# Patient Record
Sex: Female | Born: 1995 | Race: Black or African American | Hispanic: No | Marital: Single | Smoking: Former smoker
Health system: Southern US, Community
[De-identification: ages and names within clinical notes are randomized; demographics above are authoritative.]

## PROBLEM LIST (undated history)

## (undated) ENCOUNTER — Inpatient Hospital Stay (HOSPITAL_COMMUNITY): Payer: Self-pay

## (undated) DIAGNOSIS — J40 Bronchitis, not specified as acute or chronic: Secondary | ICD-10-CM

## (undated) DIAGNOSIS — L309 Dermatitis, unspecified: Secondary | ICD-10-CM

## (undated) DIAGNOSIS — R51 Headache: Secondary | ICD-10-CM

## (undated) DIAGNOSIS — J45909 Unspecified asthma, uncomplicated: Secondary | ICD-10-CM

## (undated) DIAGNOSIS — R519 Headache, unspecified: Secondary | ICD-10-CM

## (undated) HISTORY — PX: APPENDECTOMY: SHX54

## (undated) HISTORY — PX: HERNIA REPAIR: SHX51

---
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
## 2014-01-04 ENCOUNTER — Inpatient Hospital Stay (HOSPITAL_COMMUNITY)
Admission: AD | Admit: 2014-01-04 | Discharge: 2014-01-04 | Disposition: A | Discharge status: Home / Self Care | Payer: Medicaid Other | Source: Ambulatory Visit | Attending: Obstetrics & Gynecology | Admitting: Obstetrics & Gynecology

## 2014-01-04 ENCOUNTER — Encounter (HOSPITAL_COMMUNITY): Payer: Self-pay | Admitting: *Deleted

## 2014-01-04 DIAGNOSIS — F17210 Nicotine dependence, cigarettes, uncomplicated: Secondary | ICD-10-CM | POA: Insufficient documentation

## 2014-01-04 DIAGNOSIS — N946 Dysmenorrhea, unspecified: Secondary | ICD-10-CM | POA: Diagnosis not present

## 2014-01-04 DIAGNOSIS — N939 Abnormal uterine and vaginal bleeding, unspecified: Secondary | ICD-10-CM | POA: Diagnosis present

## 2014-01-04 LAB — URINALYSIS, ROUTINE W REFLEX MICROSCOPIC
Bilirubin Urine: NEGATIVE
Glucose, UA: NEGATIVE mg/dL
Ketones, ur: NEGATIVE mg/dL
LEUKOCYTES UA: NEGATIVE
NITRITE: NEGATIVE
PH: 6 (ref 5.0–8.0)
Protein, ur: NEGATIVE mg/dL
SPECIFIC GRAVITY, URINE: 1.02 (ref 1.005–1.030)
Urobilinogen, UA: 0.2 mg/dL (ref 0.0–1.0)

## 2014-01-04 LAB — WET PREP, GENITAL
Clue Cells Wet Prep HPF POC: NONE SEEN
Trich, Wet Prep: NONE SEEN
YEAST WET PREP: NONE SEEN

## 2014-01-04 LAB — POCT PREGNANCY, URINE: Preg Test, Ur: NEGATIVE

## 2014-01-04 LAB — URINE MICROSCOPIC-ADD ON

## 2014-01-04 LAB — HCG, QUANTITATIVE, PREGNANCY: hCG, Beta Chain, Quant, S: 2 m[IU]/mL (ref <5)

## 2014-01-04 NOTE — Discharge Instructions (Signed)
Your pregnancy test here today is negative and your blood work shows you are not pregnant. Take ibuprofen as needed for your cramps. Follow up with your GYN or return here as needed.   Dysmenorrhea Menstrual cramps (dysmenorrhea) are caused by the muscles of the uterus tightening (contracting) during a menstrual period. For some women, this discomfort is merely bothersome. For others, dysmenorrhea can be severe enough to interfere with everyday activities for a few days each month. Primary dysmenorrhea is menstrual cramps that last a couple of days when you start having menstrual periods or soon after. This often begins after a teenager starts having her period. As a woman gets older or has a baby, the cramps will usually lessen or disappear. Secondary dysmenorrhea begins later in life, lasts longer, and the pain may be stronger than primary dysmenorrhea. The pain may start before the period and last a few days after the period.  CAUSES  Dysmenorrhea is usually caused by an underlying problem, such as:  The tissue lining the uterus grows outside of the uterus in other areas of the body (endometriosis).  The endometrial tissue, which normally lines the uterus, is found in or grows into the muscular walls of the uterus (adenomyosis).  The pelvic blood vessels are engorged with blood just before the menstrual period (pelvic congestive syndrome).  Overgrowth of cells (polyps) in the lining of the uterus or cervix.  Falling down of the uterus (prolapse) because of loose or stretched ligaments.  Depression.  Bladder problems, infection, or inflammation.  Problems with the intestine, a tumor, or irritable bowel syndrome.  Cancer of the female organs or bladder.  A severely tipped uterus.  A very tight opening or closed cervix.  Noncancerous tumors of the uterus (fibroids).  Pelvic inflammatory disease (PID).  Pelvic scarring (adhesions) from a previous surgery.  Ovarian cyst.  An  intrauterine device (IUD) used for birth control. RISK FACTORS You may be at greater risk of dysmenorrhea if:  You are younger than age 19.  You started puberty early.  You have irregular or heavy bleeding.  You have never given birth.  You have a family history of this problem.  You are a smoker. SIGNS AND SYMPTOMS   Cramping or throbbing pain in your lower abdomen.  Headaches.  Lower back pain.  Nausea or vomiting.  Diarrhea.  Sweating or dizziness.  Loose stools. DIAGNOSIS  A diagnosis is based on your history, symptoms, physical exam, diagnostic tests, or procedures. Diagnostic tests or procedures may include:  Blood tests.  Ultrasonography.  An examination of the lining of the uterus (dilation and curettage, D&C).  An examination inside your abdomen or pelvis with a scope (laparoscopy).  X-rays.  CT scan.  MRI.  An examination inside the bladder with a scope (cystoscopy).  An examination inside the intestine or stomach with a scope (colonoscopy, gastroscopy). TREATMENT  Treatment depends on the cause of the dysmenorrhea. Treatment may include:  Pain medicine prescribed by your health care provider.  Birth control pills or an IUD with progesterone hormone in it.  Hormone replacement therapy.  Nonsteroidal anti-inflammatory drugs (NSAIDs). These may help stop the production of prostaglandins.  Surgery to remove adhesions, endometriosis, ovarian cyst, or fibroids.  Removal of the uterus (hysterectomy).  Progesterone shots to stop the menstrual period.  Cutting the nerves on the sacrum that go to the female organs (presacral neurectomy).  Electric current to the sacral nerves (sacral nerve stimulation).  Antidepressant medicine.  Psychiatric therapy, counseling, or group therapy.  Exercise and physical therapy.  Meditation and yoga therapy.  Acupuncture. HOME CARE INSTRUCTIONS   Only take over-the-counter or prescription medicines as  directed by your health care provider.  Place a heating pad or hot water bottle on your lower back or abdomen. Do not sleep with the heating pad.  Use aerobic exercises, walking, swimming, biking, and other exercises to help lessen the cramping.  Massage to the lower back or abdomen may help.  Stop smoking.  Avoid alcohol and caffeine. SEEK MEDICAL CARE IF:   Your pain does not get better with medicine.  You have pain with sexual intercourse.  Your pain increases and is not controlled with medicines.  You have abnormal vaginal bleeding with your period.  You develop nausea or vomiting with your period that is not controlled with medicine. SEEK IMMEDIATE MEDICAL CARE IF:  You pass out.  Document Released: 01/04/2005 Document Revised: 09/06/2012 Document Reviewed: 06/22/2012 [facility] Patient Information 2015 [place], [place]. This information is not intended to replace advice given to you by your health care provider. Make sure you discuss any questions you have with your health care provider.

## 2014-01-04 NOTE — MAU Provider Note (Signed)
History     CSN: [id number]  Arrival date and time: 01/04/14 1408   None     Chief Complaint  Patient presents with  . Abdominal Pain  . Vaginal Bleeding   Patient is a 18 y.o. female presenting with abdominal pain and vaginal bleeding. The history is provided by the patient.  Abdominal Pain The primary symptoms of the illness include abdominal pain and vaginal bleeding. The current episode started 6 to 12 hours ago. The onset of the illness was sudden.  The patient has not had a change in bowel habit.  Vaginal Bleeding Associated symptoms include abdominal pain.   Cassidy Meyer is a 18 y.o. G3P0020 who presents to the MAU with bleeding that started early this morning. She reports having had a positive HPT about 3 days ago. She reports abdominal cramping in addition to the bleeding. She denies any other problems today.   OB History    Gravida Para Term Preterm AB TAB SAB Ectopic Multiple Living   3 0   2           Past Medical History  Diagnosis Date  . Asthma   . Bronchitis     Past Surgical History  Procedure Laterality Date  . Appendectomy    . Hernia repair      No family history on file.  History  Substance Use Topics  . Smoking status: Current Every Day Smoker    Types: Cigarettes  . Smokeless tobacco: Never Used  . Alcohol Use: No    Allergies: No Known Allergies  Prescriptions prior to admission  Medication Sig Dispense Refill Last Dose  . cephALEXin (KEFLEX) 500 MG capsule Take 1 capsule (500 mg total) by mouth 4 (four) times daily. 40 capsule 0   . metroNIDAZOLE (FLAGYL) 500 MG tablet Take 1 tablet (500 mg total) by mouth 2 (two) times daily. 14 tablet 0     Review of Systems  Gastrointestinal: Positive for abdominal pain.  Genitourinary: Positive for vaginal bleeding.  all other systems negative  Blood pressure 125/66, pulse 74, temperature 99.4 F (37.4 C), temperature source Oral, resp. rate 16, height 5' 3.5" (1.613 m), weight 137 lb  3.2 oz (62.234 kg), last menstrual period 11/05/2013.  Physical Exam  Constitutional: She is oriented to person, place, and time. She appears well-developed and well-nourished. No distress.  HENT:  Head: Normocephalic and atraumatic.  Eyes: Conjunctivae and EOM are normal.  Neck: Neck supple.  Cardiovascular: Normal rate.   Respiratory: Effort normal.  GI: Soft. There is tenderness.  Mildly tender with deep palpation lower abdomen.   Genitourinary:  External genital without lesions. Small blood vaginal vault. Mild CMT, no adnexal tenderness, uterus without palpable enlargement.   Musculoskeletal: Normal range of motion.  Neurological: She is alert and oriented to person, place, and time.  Skin: Skin is warm and dry.  Psychiatric: She has a normal mood and affect. Her behavior is normal. Judgment and thought content normal.    MAU Course  Procedures Results for orders placed or performed during the hospital encounter of 01/04/14 (from the past 24 hour(s))  Urinalysis, Routine w reflex microscopic     Status: Abnormal   Collection Time: 01/04/14  2:50 PM  Result Value Ref Range   Color, Urine YELLOW YELLOW   APPearance CLOUDY (A) CLEAR   Specific Gravity, Urine 1.020 1.005 - 1.030   pH 6.0 5.0 - 8.0   Glucose, UA NEGATIVE NEGATIVE mg/dL   Hgb urine dipstick LARGE (A)  NEGATIVE   Bilirubin Urine NEGATIVE NEGATIVE   Ketones, ur NEGATIVE NEGATIVE mg/dL   Protein, ur NEGATIVE NEGATIVE mg/dL   Urobilinogen, UA 0.2 0.0 - 1.0 mg/dL   Nitrite NEGATIVE NEGATIVE   Leukocytes, UA NEGATIVE NEGATIVE  Urine microscopic-add on     Status: Abnormal   Collection Time: 01/04/14  2:50 PM  Result Value Ref Range   Squamous Epithelial / LPF FEW (A) RARE   RBC / HPF TOO NUMEROUS TO COUNT <3 RBC/hpf   Bacteria, UA FEW (A) RARE  Pregnancy, urine POC     Status: None   Collection Time: 01/04/14  2:57 PM  Result Value Ref Range   Preg Test, Ur NEGATIVE NEGATIVE  hCG, quantitative, pregnancy      Status: None   Collection Time: 01/04/14  4:03 PM  Result Value Ref Range   hCG, Beta Chain, Quant, S 2 <5 mIU/mL    MDM 18 y.o. female with vaginal bleeding and lower abdominal cramping that started today. Patient was concerned because she had a positive HPT 3 days ago. Pregnancy test here today negative and Bhcg<5. Will treat for dysmenorrhea.   Assessment and Plan  Dysmenorrhea Ibuprofen as needed for pain. Follow up with GYN, return here as needed.   NEESE,HOPE 01/04/2014, 4:40 PM

## 2014-01-04 NOTE — MAU Note (Signed)
Pos HPT 2 days ago - stated pt was 1-2 weeks, started cramping & bleeding last night.

## 2014-01-05 LAB — GC/CHLAMYDIA PROBE AMP
CT PROBE, AMP APTIMA: NEGATIVE
GC Probe RNA: NEGATIVE

## 2014-01-24 ENCOUNTER — Other Ambulatory Visit (HOSPITAL_COMMUNITY)
Admission: RE | Admit: 2014-01-24 | Discharge: 2014-01-24 | Disposition: A | Discharge status: Home / Self Care | Payer: Medicaid Other | Source: Ambulatory Visit | Attending: Family Medicine | Admitting: Family Medicine

## 2014-01-24 ENCOUNTER — Encounter: Payer: Self-pay | Admitting: Family Medicine

## 2014-01-24 ENCOUNTER — Ambulatory Visit (INDEPENDENT_AMBULATORY_CARE_PROVIDER_SITE_OTHER): Payer: Medicaid Other | Admitting: Family Medicine

## 2014-01-24 VITALS — BP 112/57 | HR 73 | Temp 98.3°F | Resp 18 | Wt 130.0 lb

## 2014-01-24 DIAGNOSIS — Z7251 High risk heterosexual behavior: Secondary | ICD-10-CM

## 2014-01-24 DIAGNOSIS — N760 Acute vaginitis: Secondary | ICD-10-CM

## 2014-01-24 DIAGNOSIS — Z113 Encounter for screening for infections with a predominantly sexual mode of transmission: Secondary | ICD-10-CM | POA: Diagnosis not present

## 2014-01-24 DIAGNOSIS — N898 Other specified noninflammatory disorders of vagina: Secondary | ICD-10-CM

## 2014-01-24 DIAGNOSIS — Z3009 Encounter for other general counseling and advice on contraception: Secondary | ICD-10-CM

## 2014-01-24 DIAGNOSIS — A499 Bacterial infection, unspecified: Secondary | ICD-10-CM

## 2014-01-24 DIAGNOSIS — B9689 Other specified bacterial agents as the cause of diseases classified elsewhere: Secondary | ICD-10-CM

## 2014-01-24 LAB — POCT URINALYSIS DIPSTICK
BILIRUBIN UA: NEGATIVE
Blood, UA: NEGATIVE
GLUCOSE UA: NEGATIVE
Ketones, UA: NEGATIVE
Leukocytes, UA: NEGATIVE
NITRITE UA: NEGATIVE
PH UA: 6
Protein, UA: NEGATIVE
Spec Grav, UA: >=1.03
UROBILINOGEN UA: 0.2

## 2014-01-24 LAB — POCT WET PREP (WET MOUNT): Clue Cells Wet Prep Whiff POC: POSITIVE

## 2014-01-24 LAB — POCT URINE PREGNANCY: PREG TEST UR: NEGATIVE

## 2014-01-24 MED ORDER — CLINDAMYCIN HCL 300 MG PO CAPS: 300.0000 mg | ORAL_CAPSULE | Freq: Two times a day (BID) | ORAL | Status: DC

## 2014-01-24 NOTE — Progress Notes (Signed)
Subjective: Stacy GardnerJessica Crochet is a 19 y.o. female presenting for recurrent vaginal discharge.  Having vaginal discharge for 12 months. White, constant, with fishy odor. Has tried metrogel, po flagyl and keflex without improvement in symptoms. No vaginal irritation/burning. No dyspareunia. Has sexual intercourse with single female using condoms inconsistently (depends only on availability). [facility]   - Menstrual periods are irregular lasting 3-7 days every 28-35 days.  LMP: 01/11/2014 - Review of Systems: Denies fever, VB, breast swelling/tenderness, abdominal pain, genital ulcers or missed menstrual period. - Nonsmoker, denies drinking EtOH and other drugs Objective: BP 112/57 mmHg  Pulse 73  Temp(Src) 98.3 F (36.8 C) (Oral)  Resp 18  Wt 130 lb (58.968 kg)  SpO2 100%  LMP 01/11/2014  Gen:  19 y.o. female in no distress Pelvic: External genitalia within normal limits.  Vaginal mucosa pink, moist, normal rugae.  Nonfriable cervix without lesions, moderate white malodorous discharge noted on speculum exam. No cervical motion tenderness.   Jimmy FootmanSara Evans, CMA present throughout duration of exam.  Assessment/Plan: Stacy GardnerJessica Flatt is a 19 y.o. female here for recurrent vaginal discharge.  1. 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 General counseling and advice on female contraception - F/u with PCP; favoring nexplanon  3. Unprotected sexual intercourse - Extensive counseling during today's OV.  - Check STD's and UPreg

## 2014-01-24 NOTE — Assessment & Plan Note (Signed)
Discussed risks and consequences of unprotected intercourse at length. Only moderate insight into this. Uses condoms with single female partner irregularly.  - Has reservations about infection risks with IUD and nuva ring (though the insignificance of this risk in proportion to pregnancy risk was reinforced) - Has not tried depo and is apprehensive due to hair loss and weight gain in a friend receiving this - Has demonstrated inability to reliably take daily OCPs - From this discussion I believe nexplanon may be an ideal option for her. All questions answered and she will make a follow up appointment with her PCP in the next few weeks to discuss this further and hopefully decide on reliable contraceptive option.

## 2014-01-24 NOTE — Assessment & Plan Note (Addendum)
Chronic problem beginning about 12 months ago. Chronic and refractory vs. recurrent. Denies ANY improvement in symptoms with ALL treatments so doubt this is simply recurrence. Though, still did recommend that her partner be evaluated as soon as possible.  Has received oral flagyl, keflex and metrogel without improvement.  - Given exam today we will try tinidazole or clindamycin po (pending negative UPT) which will require prior authorization for medicaid. Will avoid clindamycin ovules as these may diminish effectiveness of condoms (her only, albeit irregular, form of contraception)

## 2014-01-24 NOTE — Patient Instructions (Signed)
Please schedule an appointment to discuss birth control options with Dr. Lum BabeEniola, your primary doctor, before leaving today.

## 2014-01-25 ENCOUNTER — Encounter: Payer: Self-pay | Admitting: *Deleted

## 2014-01-25 ENCOUNTER — Telehealth: Payer: Self-pay | Admitting: *Deleted

## 2014-01-25 NOTE — Telephone Encounter (Signed)
-----   Message from Tyrone Nineyan B Grunz, MD sent at 01/24/2014  5:47 PM EST ----- Please call to let her know labs showed BV and a prescription for clindamycin x7 days. this may require prior authorization which I will be happy to complete.

## 2014-01-25 NOTE — Progress Notes (Unsigned)
Pt walked into clinic this AM requesting results from her wet prep.  Precept with Dr. Randolm IdolFletke; reviewed office notes and informed that the provider has to review results and will give her a call due to her previous failure of other medications for bacterial vaginosis.  Clovis PuMartin, Yamilee Harmes L, RN

## 2014-01-25 NOTE — Telephone Encounter (Signed)
Called pt and informed her of the below information and instructed her if she had any problems to give us a call. Lamonte SakaiZimmerman Rumple, Yoshino Broccoli D

## 2014-01-28 LAB — CERVICOVAGINAL ANCILLARY ONLY
CHLAMYDIA, DNA PROBE: NEGATIVE
Neisseria Gonorrhea: NEGATIVE

## 2014-01-30 NOTE — Progress Notes (Signed)
Can you ask her if she got the prescription for clindamycin? This is what was prescribed for the recurrent BV on wet prep.

## 2014-04-19 ENCOUNTER — Ambulatory Visit (INDEPENDENT_AMBULATORY_CARE_PROVIDER_SITE_OTHER): Payer: Medicaid Other | Admitting: Family Medicine

## 2014-04-19 VITALS — BP 107/80 | HR 76 | Temp 98.3°F | Ht 63.0 in | Wt 124.3 lb

## 2014-04-19 DIAGNOSIS — R51 Headache: Secondary | ICD-10-CM

## 2014-04-19 DIAGNOSIS — F32A Depression, unspecified: Secondary | ICD-10-CM

## 2014-04-19 DIAGNOSIS — F329 Major depressive disorder, single episode, unspecified: Secondary | ICD-10-CM

## 2014-04-19 DIAGNOSIS — M26609 Unspecified temporomandibular joint disorder, unspecified side: Secondary | ICD-10-CM | POA: Insufficient documentation

## 2014-04-19 DIAGNOSIS — R519 Headache, unspecified: Secondary | ICD-10-CM

## 2014-04-19 DIAGNOSIS — M2660 Temporomandibular joint disorder, unspecified: Secondary | ICD-10-CM

## 2014-04-19 MED ORDER — CITALOPRAM HYDROBROMIDE 20 MG PO TABS: 20.0000 mg | ORAL_TABLET | Freq: Every day | ORAL | Status: DC

## 2014-04-19 NOTE — Assessment & Plan Note (Addendum)
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 No signs of LAD in area the patient is concerned about. No signs of swelling or infection. 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 Discussed icing the area and continuing tylenol for discomfort. If this does not improve or she develops an additional lump, she is to f/u with her PCP.

## 2014-04-19 NOTE — Patient Instructions (Signed)
Nice to meet you. 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 You can take tylenol and ice the area for this pain.  Please let us know if this continues so we can reevaluate. Please continue to take the tylenol for your headaches. We will start you on celexa for the depression. Please contact [facility] for your depression to schedule an appointment.  Please seek medical attention if you develop numbness, weakness, change in vision, facial droop, or change in gait.  Seek medical attention if you develop thoughts of hurting your self or anyone else.
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
## 2014-04-21 ENCOUNTER — Encounter: Payer: Self-pay | Admitting: Family Medicine

## 2014-04-21 DIAGNOSIS — R519 Headache, unspecified: Secondary | ICD-10-CM | POA: Insufficient documentation

## 2014-04-21 DIAGNOSIS — F329 Major depressive disorder, single episode, unspecified: Secondary | ICD-10-CM | POA: Insufficient documentation

## 2014-04-21 DIAGNOSIS — F32A Depression, unspecified: Secondary | ICD-10-CM | POA: Insufficient documentation

## 2014-04-21 DIAGNOSIS — R51 Headache: Secondary | ICD-10-CM

## 2014-04-21 NOTE — Assessment & Plan Note (Signed)
Throughout the whole encounter the patient and her friends felt everything we discussed was funny. I discussed with them the severity of this issue in that if she were to have thoughts of hurting herself or others or ever developed a plan to do these things we would have to send her to the ED for evaluation and if this happened while she was at home she would need to call 911 for immediate assistance. She voiced understanding of this. No SI, plan to hurt herself, or intent to hurt her self. She does note some thoughts of hurting others, though no plan to do this or intent to do this. Will start the patient on celexa to treat this depression. I recommended that she call [facility] for therapy and gave her the handout with information on [facility]. Strongly urged her to set up an appointment with [facility]. Given return precautions.   Precepted with Dr Lum BabeEniola.

## 2014-04-21 NOTE — Assessment & Plan Note (Signed)
New onset headaches. Is neurologically intact. Sounds like could be migranous in nature, though unilateral and lasting for a defined period of time brings cluster headaches into question. Could also be tension type headaches given they coincide with onset of right TMJ. Since tylenol relieves discomfort urged to continue tylenol as needed. Will continue to monitor at this time. Given return precautions.

## 2014-04-22 NOTE — Progress Notes (Signed)
One of the available preceptor of the day. 

## 2014-09-30 ENCOUNTER — Ambulatory Visit (INDEPENDENT_AMBULATORY_CARE_PROVIDER_SITE_OTHER): Payer: Medicaid Other | Admitting: *Deleted

## 2014-09-30 DIAGNOSIS — Z111 Encounter for screening for respiratory tuberculosis: Secondary | ICD-10-CM

## 2014-09-30 NOTE — Progress Notes (Signed)
   PPD placed Left Forearm.  Pt to return 10/02/14 for reading.  Pt tolerated intradermal injection. Jordanny Waddington L, RN          

## 2014-10-02 ENCOUNTER — Ambulatory Visit (INDEPENDENT_AMBULATORY_CARE_PROVIDER_SITE_OTHER): Payer: Medicaid Other | Admitting: *Deleted

## 2014-10-02 ENCOUNTER — Encounter: Payer: Self-pay | Admitting: *Deleted

## 2014-10-02 DIAGNOSIS — Z7689 Persons encountering health services in other specified circumstances: Secondary | ICD-10-CM

## 2014-10-02 DIAGNOSIS — Z111 Encounter for screening for respiratory tuberculosis: Secondary | ICD-10-CM

## 2014-10-02 LAB — TB SKIN TEST
Induration: 0 mm
TB Skin Test: NEGATIVE

## 2014-10-02 NOTE — Progress Notes (Signed)
   PPD Reading Note PPD read and results entered in EpicCare. Result: 0 mm induration. Interpretation: Negative If test not read within 48-72 hours of initial placement, patient advised to repeat in other arm 1-3 weeks after this test. Allergic reaction: no  Martin, Tamika L, RN  

## 2014-10-17 ENCOUNTER — Emergency Department (HOSPITAL_COMMUNITY)
Admission: EM | Admit: 2014-10-17 | Discharge: 2014-10-17 | Disposition: A | Discharge status: Home / Self Care | Payer: Medicaid Other | Attending: Emergency Medicine | Admitting: Emergency Medicine

## 2014-10-17 ENCOUNTER — Encounter (HOSPITAL_COMMUNITY): Payer: Self-pay | Admitting: *Deleted

## 2014-10-17 DIAGNOSIS — J029 Acute pharyngitis, unspecified: Secondary | ICD-10-CM | POA: Insufficient documentation

## 2014-10-17 DIAGNOSIS — Z79899 Other long term (current) drug therapy: Secondary | ICD-10-CM | POA: Insufficient documentation

## 2014-10-17 DIAGNOSIS — J45909 Unspecified asthma, uncomplicated: Secondary | ICD-10-CM | POA: Diagnosis not present

## 2014-10-17 DIAGNOSIS — Z720 Tobacco use: Secondary | ICD-10-CM | POA: Diagnosis not present

## 2014-10-17 NOTE — Discharge Instructions (Signed)
Please read attached information, please use ibuprofen or Tylenol as needed for discomfort. Please follow-up with your primary care provider in 3-5 days for reevaluation. Please return immediately if new or worsening signs or symptoms present.

## 2014-10-17 NOTE — ED Provider Notes (Signed)
CSN: [id number]     Arrival date & time 10/17/14  0725 History   First MD Initiated Contact with Patient 10/17/14 [id number]     Chief Complaint  Patient presents with  . Sore Throat    Patient is a 19 y.o. female presenting with pharyngitis.  Sore Throat     19 year old female presents today with complaints of sore throat. Patient reports for the past 2 days she's had sore throat, painful swallowing. Patient reports subjective fevers of 100.1. Patient has not tried any over-the-counter therapies for this. Patient denies any difficulty breathing, swallowing, drooling, changes in voice, neck stiffness, headache, chest pain, abdominal pain, rash, or any other concerning signs or symptoms. Patient reports intermittent nonproductive cough.  Past Medical History  Diagnosis Date  . Asthma   . Bronchitis    Past Surgical History  Procedure Laterality Date  . Appendectomy    . Hernia repair     History reviewed. No pertinent family history. Social History  Substance Use Topics  . Smoking status: Current Every Day Smoker    Types: Cigarettes  . Smokeless tobacco: Never Used  . Alcohol Use: No   OB History    Gravida Para Term Preterm AB TAB SAB Ectopic Multiple Living   3 0   2    0      Review of Systems  All other systems reviewed and are negative.   Allergies  Review of patient's allergies indicates no known allergies.  Home Medications   Prior to Admission medications   Medication Sig Start Date End Date Taking? Authorizing Provider  citalopram (CELEXA) 20 MG tablet Take 1 tablet (20 mg total) by mouth daily. 04/19/14   Glori Luis, MD   BP 130/75 mmHg  Pulse 80  Temp(Src) 98.3 F (36.8 C) (Oral)  Resp 16  Ht  (1.6 m)  Wt 120 lb (54.432 kg)  BMI 21.26 kg/m2  SpO2 100%  LMP 10/17/2014  Breastfeeding? No   Physical Exam  Constitutional: She is oriented to person, place, and time. She appears well-developed and well-nourished.  HENT:  Head: Normocephalic  and atraumatic.  Right Ear: Hearing, tympanic membrane and external ear normal.  Left Ear: Hearing, tympanic membrane and external ear normal.  Mouth/Throat: Uvula is midline, oropharynx is clear and moist and mucous membranes are normal. No oropharyngeal exudate, posterior oropharyngeal edema, posterior oropharyngeal erythema or tonsillar abscesses.  Tonsils symmetrical bilateral  Eyes: Conjunctivae are normal. Pupils are equal, round, and reactive to light. Right eye exhibits no discharge. Left eye exhibits no discharge. No scleral icterus.  Neck: Normal range of motion. Neck supple. No JVD present. No tracheal deviation present.  Cardiovascular: Normal rate, regular rhythm, normal heart sounds and intact distal pulses.  Exam reveals no gallop and no friction rub.   No murmur heard. Pulmonary/Chest: Effort normal and breath sounds normal. No stridor. No respiratory distress. She has no wheezes. She has no rales. She exhibits no tenderness.  Musculoskeletal: Normal range of motion. She exhibits no edema.  Neurological: She is alert and oriented to person, place, and time. Coordination normal.  Skin: Skin is warm and dry. No rash noted. No erythema. No pallor.  Psychiatric: She has a normal mood and affect. Her behavior is normal. Judgment and thought content normal.  Nursing note and vitals reviewed.     ED Course  Procedures (including critical care time) Labs Review Labs Reviewed - No data to display  Imaging Review No results found. I have personally  reviewed and evaluated these images and lab results as part of my medical decision-making.   EKG Interpretation None      MDM   Final diagnoses:  Pharyngitis   Labs:  Imaging:  Consults:  Therapeutics:  Discharge Meds:   Assessment/Plan: Pt presents with sore throat likely viral pharyngitis. No difficulty swallowing, drooling, dysphonia,muffled voice, stridor, swelling of the neck, trismus, mouth pain, swelling/ pain in  submandibular area or floor of mouth ,assymetry of tonsils, or ulcerations; unlikely epiglottitis, PTA, submandibular space infection, retropharyngeal space infection, or HIV.  given strict return precautions, PCP follow-up for re-evaluation if symptoms persist beyond 5-7 days in duration, return to the ED if they worsen. Pt verbalized understanding and agreement to today's plan and had no further questions or concerns at the time of discharge. \         Eyvonne Mechanic, PA-C 10/17/14 0818  Gilda Crease, MD 10/18/14 [phone]

## 2014-10-17 NOTE — ED Notes (Signed)
Declined W/C at D/C and was escorted to lobby by RN. 

## 2014-10-17 NOTE — ED Notes (Signed)
Pt reports a sore throat for 3 days with cough or URI SX's . Pt does reports a fever of 100.1  Checked at home.

## 2014-12-22 ENCOUNTER — Other Ambulatory Visit: Payer: Self-pay

## 2014-12-22 ENCOUNTER — Encounter (HOSPITAL_COMMUNITY): Payer: Self-pay | Admitting: Emergency Medicine

## 2014-12-22 ENCOUNTER — Emergency Department (HOSPITAL_COMMUNITY)
Admission: EM | Admit: 2014-12-22 | Discharge: 2014-12-22 | Disposition: A | Discharge status: Home / Self Care | Payer: Medicaid Other | Attending: Emergency Medicine | Admitting: Emergency Medicine

## 2014-12-22 DIAGNOSIS — F17210 Nicotine dependence, cigarettes, uncomplicated: Secondary | ICD-10-CM | POA: Diagnosis not present

## 2014-12-22 DIAGNOSIS — R109 Unspecified abdominal pain: Secondary | ICD-10-CM | POA: Insufficient documentation

## 2014-12-22 DIAGNOSIS — J45909 Unspecified asthma, uncomplicated: Secondary | ICD-10-CM | POA: Insufficient documentation

## 2014-12-22 DIAGNOSIS — R51 Headache: Secondary | ICD-10-CM | POA: Insufficient documentation

## 2014-12-22 DIAGNOSIS — R110 Nausea: Secondary | ICD-10-CM | POA: Diagnosis not present

## 2014-12-22 DIAGNOSIS — R55 Syncope and collapse: Secondary | ICD-10-CM | POA: Diagnosis not present

## 2014-12-22 DIAGNOSIS — Z3202 Encounter for pregnancy test, result negative: Secondary | ICD-10-CM | POA: Insufficient documentation

## 2014-12-22 DIAGNOSIS — R519 Headache, unspecified: Secondary | ICD-10-CM

## 2014-12-22 DIAGNOSIS — R42 Dizziness and giddiness: Secondary | ICD-10-CM

## 2014-12-22 LAB — URINALYSIS, ROUTINE W REFLEX MICROSCOPIC
BILIRUBIN URINE: NEGATIVE
Glucose, UA: NEGATIVE mg/dL
Hgb urine dipstick: NEGATIVE
KETONES UR: NEGATIVE mg/dL
Leukocytes, UA: NEGATIVE
Nitrite: NEGATIVE
Protein, ur: 30 mg/dL — AB
Specific Gravity, Urine: 1.031 — ABNORMAL HIGH (ref 1.005–1.030)
pH: 5.5 (ref 5.0–8.0)

## 2014-12-22 LAB — COMPREHENSIVE METABOLIC PANEL
ALBUMIN: 3.6 g/dL (ref 3.5–5.0)
ALK PHOS: 49 U/L (ref 38–126)
ALT: 14 U/L (ref 14–54)
ANION GAP: 7 (ref 5–15)
AST: 17 U/L (ref 15–41)
BUN: 8 mg/dL (ref 6–20)
CALCIUM: 8.7 mg/dL — AB (ref 8.9–10.3)
CO2: 27 mmol/L (ref 22–32)
Chloride: 105 mmol/L (ref 101–111)
Creatinine, Ser: 0.76 mg/dL (ref 0.44–1.00)
GFR calc Af Amer: >60 mL/min (ref >60)
GFR calc non Af Amer: >60 mL/min (ref >60)
GLUCOSE: 87 mg/dL (ref 65–99)
POTASSIUM: 3.7 mmol/L (ref 3.5–5.1)
SODIUM: 139 mmol/L (ref 135–145)
TOTAL PROTEIN: 6.3 g/dL — AB (ref 6.5–8.1)
Total Bilirubin: 0.5 mg/dL (ref 0.3–1.2)

## 2014-12-22 LAB — CBC
HEMATOCRIT: 39.2 % (ref 36.0–46.0)
Hemoglobin: 12.8 g/dL (ref 12.0–15.0)
MCH: 27.5 pg (ref 26.0–34.0)
MCHC: 32.7 g/dL (ref 30.0–36.0)
MCV: 84.3 fL (ref 78.0–100.0)
Platelets: 207 10*3/uL (ref 150–400)
RBC: 4.65 MIL/uL (ref 3.87–5.11)
RDW: 14.4 % (ref 11.5–15.5)
WBC: 4.4 10*3/uL (ref 4.0–10.5)

## 2014-12-22 LAB — URINE MICROSCOPIC-ADD ON

## 2014-12-22 LAB — LIPASE, BLOOD: Lipase: 26 U/L (ref 11–51)

## 2014-12-22 LAB — POC URINE PREG, ED: PREG TEST UR: NEGATIVE

## 2014-12-22 MED ORDER — ONDANSETRON 8 MG PO TBDP: 8.0000 mg | ORAL_TABLET | Freq: Three times a day (TID) | ORAL | Status: DC | PRN

## 2014-12-22 MED ORDER — ACETAMINOPHEN 325 MG PO TABS
650.0000 mg | ORAL_TABLET | Freq: Once | ORAL | Status: AC
  Administered 2014-12-22: 650 mg via ORAL
  Filled 2014-12-22: qty 2

## 2014-12-22 MED ORDER — SODIUM CHLORIDE 0.9 % IV BOLUS (SEPSIS): 1500.0000 mL | Freq: Once | INTRAVENOUS | Status: DC

## 2014-12-22 NOTE — ED Notes (Signed)
Pt. reports mid abdominal pain with dizziness , nausea and brief syncopal episode " I passed out" at home this evening . Denies fever or diarrhea.

## 2014-12-22 NOTE — ED Provider Notes (Signed)
CSN: [id number]     Arrival date & time 12/22/14  0146 History   First MD Initiated Contact with Patient 12/22/14 0404     Chief Complaint  Patient presents with  . Abdominal Pain  . Dizziness  . Loss of Consciousness     HPI Patient reports she's had nausea over the past 24 hours without vomiting.  She denies diarrhea.  No melena or hematochezia.  She states she was standing for prolonged period time tonight when she became lightheaded and "dizzy".  She reports a syncopal episode but does remember falling towards the ground.  She reports she's been on her menstrual cycle over the past 4-5 days reports this is a normal length of time for her.  She has been told she has been anemic before in the past.  No other significant complaint.  She denies chest pain shortness of breath.  She denies abdominal pain.  She denies dysuria or urinary frequency.  She denies headache at this time.  No neck pain or stiffness.  Denies weakness of her arms or legs.  Drums are mild in severity.  She reports she's been eating and drinking normally with the past several days.  Denies fever   Past Medical History  Diagnosis Date  . Asthma   . Bronchitis    Past Surgical History  Procedure Laterality Date  . Appendectomy    . Hernia repair     No family history on file. Social History  Substance Use Topics  . Smoking status: Current Every Day Smoker    Types: Cigarettes  . Smokeless tobacco: Never Used  . Alcohol Use: No   OB History    Gravida Para Term Preterm AB TAB SAB Ectopic Multiple Living   3 0   2    0      Review of Systems  All other systems reviewed and are negative.     Allergies  Review of patient's allergies indicates no known allergies.  Home Medications   Prior to Admission medications   Medication Sig Start Date End Date Taking? Authorizing Provider  citalopram (CELEXA) 20 MG tablet Take 1 tablet (20 mg total) by mouth daily. Patient not taking: Reported on 12/22/2014 04/19/14    Glori Luis, MD   BP 102/59 mmHg  Pulse 72  Temp(Src) 98.4 F (36.9 C) (Oral)  Resp 11  Ht  (1.626 m)  Wt 135 lb 6 oz (61.406 kg)  BMI 23.23 kg/m2  SpO2 99%  LMP 12/18/2014 (Approximate) Physical Exam  Constitutional: She is oriented to person, place, and time. She appears well-developed and well-nourished. No distress.  HENT:  Head: Normocephalic and atraumatic.  Eyes: EOM are normal.  Neck: Normal range of motion.  Cardiovascular: Normal rate, regular rhythm and normal heart sounds.   Pulmonary/Chest: Effort normal and breath sounds normal.  Abdominal: Soft. She exhibits no distension. There is no tenderness.  Musculoskeletal: Normal range of motion.  Neurological: She is alert and oriented to person, place, and time.  Skin: Skin is warm and dry.  Psychiatric: She has a normal mood and affect. Judgment normal.  Nursing note and vitals reviewed.   ED Course  Procedures (including critical care time) Labs Review Labs Reviewed  COMPREHENSIVE METABOLIC PANEL - Abnormal; Notable for the following:    Calcium 8.7 (*)    Total Protein 6.3 (*)    All other components within normal limits  URINALYSIS, ROUTINE W REFLEX MICROSCOPIC (NOT AT [facility]) - Abnormal; Notable for the  following:    Specific Gravity, Urine 1.031 (*)    Protein, ur 30 (*)    All other components within normal limits  URINE MICROSCOPIC-ADD ON - Abnormal; Notable for the following:    Squamous Epithelial / LPF 0-5 (*)    Bacteria, UA RARE (*)    All other components within normal limits  LIPASE, BLOOD  CBC  POC URINE PREG, ED    Imaging Review No results found. I have personally reviewed and evaluated these images and lab results as part of my medical decision-making.   EKG Interpretation None      MDM   Final diagnoses:  Nausea  Dizzy  Headache, unspecified headache type    I suspect the patient has mild volume depletion.  I offered IV fluids but she does not want any IV fluids  at this time.  Labs and urine otherwise without significant abnormalities.  Discharge home in good condition.  Primary care follow-up.  Home with a prescription for nausea medications.    Azalia BilisKevin Semiah Konczal, MD 12/22/14 [phone]

## 2014-12-22 NOTE — ED Notes (Signed)
Pt states she does not want an IV. MD notified. No new orders

## 2014-12-22 NOTE — ED Notes (Signed)
Pt was given apple juice, drank it without vomiting.

## 2015-03-11 ENCOUNTER — Ambulatory Visit (INDEPENDENT_AMBULATORY_CARE_PROVIDER_SITE_OTHER): Payer: Medicaid Other | Admitting: *Deleted

## 2015-03-11 DIAGNOSIS — Z111 Encounter for screening for respiratory tuberculosis: Secondary | ICD-10-CM

## 2015-03-11 NOTE — Progress Notes (Signed)
   PPD placed Left Forearm.  Pt to return 03/13/2015 for reading.  Pt tolerated intradermal injection. Cindie Rajagopalan L, RN          

## 2015-03-13 ENCOUNTER — Encounter: Payer: Self-pay | Admitting: *Deleted

## 2015-03-13 ENCOUNTER — Ambulatory Visit (INDEPENDENT_AMBULATORY_CARE_PROVIDER_SITE_OTHER): Payer: Medicaid Other | Admitting: *Deleted

## 2015-03-13 DIAGNOSIS — Z111 Encounter for screening for respiratory tuberculosis: Secondary | ICD-10-CM

## 2015-03-13 DIAGNOSIS — Z7689 Persons encountering health services in other specified circumstances: Secondary | ICD-10-CM

## 2015-03-13 LAB — TB SKIN TEST
Induration: 0 mm
TB SKIN TEST: NEGATIVE

## 2015-03-13 NOTE — Progress Notes (Signed)
   PPD Reading Note PPD read and results entered in EpicCare. Result: 0 mm induration. Interpretation: Negative If test not read within 48-72 hours of initial placement, patient advised to repeat in other arm 1-3 weeks after this test. Allergic reaction: no  Devontaye Ground L, RN  

## 2015-04-08 ENCOUNTER — Encounter: Payer: Medicaid Other | Admitting: Family Medicine

## 2015-04-29 ENCOUNTER — Ambulatory Visit (INDEPENDENT_AMBULATORY_CARE_PROVIDER_SITE_OTHER): Payer: Medicaid Other | Admitting: Family Medicine

## 2015-04-29 ENCOUNTER — Other Ambulatory Visit (HOSPITAL_COMMUNITY)
Admission: RE | Admit: 2015-04-29 | Discharge: 2015-04-29 | Disposition: A | Discharge status: Home / Self Care | Payer: Medicaid Other | Source: Ambulatory Visit | Attending: Family Medicine | Admitting: Family Medicine

## 2015-04-29 ENCOUNTER — Encounter: Payer: Self-pay | Admitting: Family Medicine

## 2015-04-29 VITALS — BP 114/65 | HR 70 | Temp 98.4°F | Ht 64.0 in | Wt 134.0 lb

## 2015-04-29 DIAGNOSIS — Z0000 Encounter for general adult medical examination without abnormal findings: Secondary | ICD-10-CM

## 2015-04-29 DIAGNOSIS — N898 Other specified noninflammatory disorders of vagina: Secondary | ICD-10-CM

## 2015-04-29 DIAGNOSIS — Z113 Encounter for screening for infections with a predominantly sexual mode of transmission: Secondary | ICD-10-CM | POA: Diagnosis present

## 2015-04-29 DIAGNOSIS — IMO0001 Reserved for inherently not codable concepts without codable children: Secondary | ICD-10-CM

## 2015-04-29 LAB — POCT WET PREP (WET MOUNT): CLUE CELLS WET PREP WHIFF POC: POSITIVE

## 2015-04-29 MED ORDER — TINIDAZOLE 500 MG PO TABS: 1.0000 g | ORAL_TABLET | Freq: Every day | ORAL | Status: DC

## 2015-04-29 NOTE — Progress Notes (Addendum)
Patient ID: Cassidy GardnerJessica Lambing, female   DOB: 05/22/1995, 20 y.o.   MRN: [id number] Subjective:     Cassidy Meyer is a 20 y.o. female and is here for a comprehensive physical exam. The patient reports: Recently treated for BV but still having discharge which is smelling. She has been given different antibiotics with no improvement. She continues to have symptoms. She has had multiple episodes of BV which was treated with metronidazole and Clindamycin with no improvement. Last time she got treated was 6 months and her symptoms persist. LMP 04/14/15   Social History   Social History  . Marital Status: Single    Spouse Name: N/A  . Number of Children: N/A  . Years of Education: N/A   Occupational History  . Not on file.   Social History Main Topics  . Smoking status: Current Every Day Smoker    Types: Cigarettes  . Smokeless tobacco: Never Used  . Alcohol Use: No  . Drug Use: No  . Sexual Activity: Yes    Birth Control/ Protection: None   Other Topics Concern  . Not on file   Social History Narrative   Health Maintenance  Topic Date Due  . Janet BerlinETANUS/TDAP  12/22/2013  . CHLAMYDIA SCREENING  01/25/2015  . INFLUENZA VACCINE  08/19/2015  . HIV Screening  Completed    The following portions of the patient's history were reviewed and updated as appropriate: allergies, current medications, past family history, past medical history, past social history, past surgical history and problem list.  Review of Systems Pertinent items are noted in HPI.   Objective:    BP 114/65 mmHg  Pulse 70  Temp(Src) 98.4 F (36.9 C) (Oral)  Ht 5\' 4"  (1.626 m)  Wt 134 lb (60.782 kg)  BMI 22.99 kg/m2  LMP 04/13/2015 (Approximate) General appearance: alert, cooperative and appears stated age Head: Normocephalic, without obvious abnormality, atraumatic Eyes: conjunctivae/corneas clear. PERRL, EOM's intact. Fundi benign. Ears: normal TM's and external ear canals both ears Throat: lips, mucosa, and  tongue normal; teeth and gums normal Neck: no adenopathy, no carotid bruit, no JVD, supple, symmetrical, trachea midline and thyroid not enlarged, symmetric, no tenderness/mass/nodules Lungs: clear to auscultation bilaterally Heart: regular rate and rhythm, S1, S2 normal, no murmur, click, rub or gallop Abdomen: soft, non-tender; bowel sounds normal; no masses,  no organomegaly Pelvic: cervix normal in appearance, external genitalia normal, no adnexal masses or tenderness, no cervical motion tenderness, rectovaginal septum normal, uterus normal size, shape, and consistency and + copious milky discharge Extremities: extremities normal, atraumatic, no cyanosis or edema Pulses: 2+ and symmetric Skin: Skin color, texture, turgor normal. No rashes or lesions Neurologic: Alert and oriented X 3, normal strength and tone. Normal symmetric reflexes. Normal coordination and gait Mental status: Alert, oriented, thought content appropriate Motor: grossly normal Coordination: normal Gait: Normal    Assessment:    Healthy female exam. Normal except for vaginal discharge Vaginitis      Plan:     Up todate with Tdap after reviewing immunization record. Up to date with flu shot. Due for PAP this Dec. Age appropriate counseling given. Wet prep repeated was + for BV. I escribed tinidazole since she has used metronidazole multiple times with no great success. GC/Chlamydia done, result pending. I completed her work physical exam form. F/U as needed.

## 2015-04-29 NOTE — Patient Instructions (Signed)

## 2015-04-30 ENCOUNTER — Telehealth: Payer: Self-pay | Admitting: Family Medicine

## 2015-04-30 ENCOUNTER — Telehealth: Payer: Self-pay | Admitting: *Deleted

## 2015-04-30 LAB — CERVICOVAGINAL ANCILLARY ONLY
Chlamydia: NEGATIVE
Neisseria Gonorrhea: NEGATIVE

## 2015-04-30 NOTE — Telephone Encounter (Signed)
[facility]. Formulary and PA form placed in provider box for completion. Clovis PuMartin, Oluwaseyi Tull L, RN

## 2015-04-30 NOTE — Telephone Encounter (Signed)
Form and shot record faxed. Sheronda Parran,CMA

## 2015-04-30 NOTE — Telephone Encounter (Signed)
Pt called and said that she gave papers to the doctor to fill out about her physical and proof of her T-dap. These are suppose to be faxed to her employer yesterday. She calling today with a new number to fax these to since her employer didn't receive the fax yesterday. It is [phone] attention Randa EvensJoanne. jw

## 2015-04-30 NOTE — Telephone Encounter (Signed)
Completed and given back to Tamika 

## 2015-05-01 ENCOUNTER — Telehealth: Payer: Self-pay | Admitting: Family Medicine

## 2015-05-01 NOTE — Telephone Encounter (Signed)
Message left to call back about result.     Note: GC/Chlamydia are negative.

## 2015-05-01 NOTE — Telephone Encounter (Signed)
Not sure what that means. Jazmin can you help with this please.

## 2015-05-01 NOTE — Telephone Encounter (Signed)
[employer] in requesting more information regarding need for Tinidazole 500 mg tablets.  Office notes from 04/29/15 [facility]ed [facility] for review.  Clovis PuMartin, Tamika L, RN

## 2015-05-01 NOTE — Telephone Encounter (Signed)
Patient need printout of physical showing "NO" for any restrictions on the back side of printout

## 2015-05-01 NOTE — Telephone Encounter (Signed)
Pulled previous faxed physical and circle NO for limitations at work. Jazmin Hartsell,CMA

## 2015-05-05 NOTE — Telephone Encounter (Signed)
Received PA approval for Tinidazole 500 mg via [employer].  Med approved for 04/30/15 - 05/30/15.  [facility] pharmacy informed.  PA approval number [id number]. Clovis PuMartin, Tamika L, RN

## 2015-05-12 ENCOUNTER — Ambulatory Visit (INDEPENDENT_AMBULATORY_CARE_PROVIDER_SITE_OTHER): Payer: Medicaid Other | Admitting: *Deleted

## 2015-05-12 DIAGNOSIS — Z111 Encounter for screening for respiratory tuberculosis: Secondary | ICD-10-CM | POA: Diagnosis not present

## 2015-05-12 NOTE — Progress Notes (Signed)
   PPD placed Left Forearm.  Pt to return 05/14/2015, Wednesday  for reading.  Pt tolerated intradermal injection. Clovis PuMartin, Tamika L, RN

## 2015-05-14 ENCOUNTER — Ambulatory Visit (INDEPENDENT_AMBULATORY_CARE_PROVIDER_SITE_OTHER): Payer: Medicaid Other | Admitting: *Deleted

## 2015-05-14 ENCOUNTER — Encounter: Payer: Self-pay | Admitting: *Deleted

## 2015-05-14 DIAGNOSIS — Z111 Encounter for screening for respiratory tuberculosis: Secondary | ICD-10-CM

## 2015-05-14 DIAGNOSIS — Z7689 Persons encountering health services in other specified circumstances: Secondary | ICD-10-CM

## 2015-05-14 LAB — TB SKIN TEST
Induration: 0 mm
TB SKIN TEST: NEGATIVE

## 2015-05-14 NOTE — Progress Notes (Signed)
   PPD Reading Note PPD read and results entered in EpicCare. Result: 0 mm induration. Interpretation: Negative If test not read within 48-72 hours of initial placement, patient advised to repeat in other arm 1-3 weeks after this test. Allergic reaction: yes; patient broke in fine red bumps around the area of PPD placement.  Precept with Dr. Mauricio PoBreen; patient can apply over the counter hydrocortisone cream.  If the area is not any better or itching increased to call for an appointment.    Clovis PuMartin, Zaelyn Barbary L, RN

## 2015-07-21 ENCOUNTER — Ambulatory Visit: Payer: Medicaid Other | Admitting: Family Medicine

## 2015-07-23 ENCOUNTER — Ambulatory Visit: Payer: Medicaid Other | Admitting: Internal Medicine

## 2015-10-22 ENCOUNTER — Encounter (HOSPITAL_COMMUNITY): Payer: Self-pay | Admitting: *Deleted

## 2015-10-22 DIAGNOSIS — R51 Headache: Secondary | ICD-10-CM | POA: Diagnosis present

## 2015-10-22 DIAGNOSIS — J45909 Unspecified asthma, uncomplicated: Secondary | ICD-10-CM | POA: Insufficient documentation

## 2015-10-22 DIAGNOSIS — F17210 Nicotine dependence, cigarettes, uncomplicated: Secondary | ICD-10-CM | POA: Insufficient documentation

## 2015-10-22 NOTE — ED Triage Notes (Signed)
Headache for 2 days with nausea and vomiting  With chills  lmp  now

## 2015-10-23 ENCOUNTER — Emergency Department (HOSPITAL_COMMUNITY)
Admission: EM | Admit: 2015-10-23 | Discharge: 2015-10-23 | Disposition: A | Discharge status: Home / Self Care | Payer: Medicaid Other | Attending: Emergency Medicine | Admitting: Emergency Medicine

## 2015-10-23 DIAGNOSIS — R51 Headache: Secondary | ICD-10-CM

## 2015-10-23 DIAGNOSIS — R519 Headache, unspecified: Secondary | ICD-10-CM

## 2015-10-23 MED ORDER — OXYCODONE-ACETAMINOPHEN 5-325 MG PO TABS
1.0000 | ORAL_TABLET | Freq: Once | ORAL | Status: AC
  Administered 2015-10-23: 1 via ORAL
  Filled 2015-10-23: qty 1

## 2015-10-23 MED ORDER — ONDANSETRON 4 MG PO TBDP
8.0000 mg | ORAL_TABLET | Freq: Once | ORAL | Status: AC
  Administered 2015-10-23: 8 mg via ORAL
  Filled 2015-10-23: qty 2

## 2015-10-23 NOTE — Discharge Instructions (Signed)
Get plenty of rest, and drink a lot of fluids.  Use ibuprofen or acetaminophen as needed for pain.

## 2015-10-23 NOTE — ED Provider Notes (Signed)
[facility]-EMERGENCY DEPT Provider Note   CSN: [id number] Arrival date & time: 10/22/15  2308  By signing my name below, I, Emmanuella Mensah, attest that this documentation has been prepared under the direction and in the presence of Mancel BaleElliott Tenna Lacko, MD. Electronically Signed: Angelene GiovanniEmmanuella Mensah, ED Scribe. 10/23/15. 2:27 AM.   History   Chief Complaint Chief Complaint  Patient presents with  . Headache   HPI Comments: Cassidy GardnerJessica Meyer is a 20 y.o. female who presents to the Emergency Department complaining of gradually worsening non-radiating moderate left lateral headache onset 2 days ago. She reports associated nausea and multiple episodes of non-bloody vomiting. She denies that anything specific makes her pain worse. No alleviating factors noted. Pt states that she has tried hydrocodone PTA with no relief. She reports that she began her menstrual cycle 2 days ago and normally does not have headaches with them. Pt is currently employed and does not attend school. She has NKDA. She denies any fever, diarrhea, photophobia, or any visual disturbances.   The history is provided by the patient. No language interpreter was used.  Headache   This is a new problem. The current episode started 2 days ago. The problem has been gradually worsening. The headache is associated with nothing. The pain is located in the left unilateral region. The pain is moderate. The pain does not radiate. Associated symptoms include nausea and vomiting. Pertinent negatives include no fever. She has tried oral narcotic analgesics for the symptoms. The treatment provided no relief.    Past Medical History:  Diagnosis Date  . Asthma   . Bronchitis     Patient Active Problem List   Diagnosis Date Noted  . Depression 04/21/2014  . DECREASED HEARING, BILATERAL 04/22/2009  . ECZEMA 01/06/2009  . ASTHMA, UNSPECIFIED 03/17/2006    Past Surgical History:  Procedure Laterality Date  . APPENDECTOMY    . HERNIA REPAIR       OB History    Gravida Para Term Preterm AB Living   3 0     2     SAB TAB Ectopic Multiple Live Births         0         Home Medications    Prior to Admission medications   Medication Sig Start Date End Date Taking? Authorizing Provider  tinidazole (TINDAMAX) 500 MG tablet Take 2 tablets (1,000 mg total) by mouth daily with breakfast. Take for 5 days. Avoid alcohol consumption for 72 hours after last dose. 04/29/15   Doreene ElandKehinde T Eniola, MD    Family History No family history on file.  Social History Social History  Substance Use Topics  . Smoking status: Current Every Day Smoker    Types: Cigarettes  . Smokeless tobacco: Never Used  . Alcohol use No     Allergies   Review of patient's allergies indicates no known allergies.   Review of Systems Review of Systems  Constitutional: Negative for fever.  Eyes: Negative for photophobia and visual disturbance.  Gastrointestinal: Positive for nausea and vomiting. Negative for diarrhea.  Neurological: Positive for headaches.  All other systems reviewed and are negative.    Physical Exam Updated Vital Signs BP 130/82 (BP Location: Right Arm)   Pulse 76   Temp 98.2 F (36.8 C) (Oral)   Resp 19   Ht 5' 3.5" (1.613 m)   Wt 130 lb 3 oz (59.1 kg)   LMP 10/22/2015   SpO2 99%   BMI 22.70 kg/m   Physical Exam  Constitutional: She is oriented to person, place, and time. She appears well-developed and well-nourished. No distress.  HENT:  Head: Normocephalic and atraumatic.  Eyes: Conjunctivae and EOM are normal.  Neck: Neck supple. No tracheal deviation present.  Cardiovascular: Normal rate.   Pulmonary/Chest: Effort normal. No respiratory distress.  Musculoskeletal: Normal range of motion.  Neurological: She is alert and oriented to person, place, and time.  Skin: Skin is warm and dry.  Psychiatric: She has a normal mood and affect. Her behavior is normal.  Nursing note and vitals reviewed.    ED Treatments /  Results  DIAGNOSTIC STUDIES: Oxygen Saturation is 99% on RA, normal by my interpretation.    COORDINATION OF CARE: 2:26 AM- Pt advised of plan for treatment and pt agrees. Pt will receive Percocet and Zofran for pain.    Labs (all labs ordered are listed, but only abnormal results are displayed) Labs Reviewed - No data to display  EKG  EKG Interpretation None       Radiology No results found.  Procedures Procedures (including critical care time)  Medications Ordered in ED Medications - No data to display   Initial Impression / Assessment and Plan / ED Course  Mancel Bale, MD has reviewed the triage vital signs and the nursing notes.  Pertinent labs & imaging results that were available during my care of the patient were reviewed by me and considered in my medical decision making (see chart for details).  Clinical Course    Medications  oxyCODONE-acetaminophen (PERCOCET/ROXICET) 5-325 MG per tablet 1 tablet (1 tablet Oral Given 10/23/15 0232)  ondansetron (ZOFRAN-ODT) disintegrating tablet 8 mg (8 mg Oral Given 10/23/15 0232)    Patient Vitals for the past 24 hrs:  BP Temp Temp src Pulse Resp SpO2 Height Weight  10/23/15 0345 101/64 - - (!) 55 - 99 % - -  10/23/15 0315 100/60 - - (!) 56 - 100 % - -  10/23/15 0300 103/62 - - 78 - 100 % - -  10/23/15 0245 113/71 - - 64 - 100 % - -  10/23/15 0230 120/67 - - 70 - 100 % - -  10/23/15 0215 124/84 - - 77 - 100 % - -  10/23/15 0206 130/82 98.2 F (36.8 C) Oral 76 19 99 % - -  10/22/15 2318 - - - - - - 5' 3.5" (1.613 m) 130 lb 3 oz (59.1 kg)  10/22/15 2316 132/88 98.3 F (36.8 C) - 77 16 100 % - -    4:28 AM Reevaluation with update and discussion. After initial assessment and treatment, an updated evaluation reveals Her headache is now better and she is tolerating water without nausea or vomiting. Findings discussed with the patient, and all questions were answered. Zabdi Mis L    Final Clinical Impressions(s) /  ED Diagnoses   Final diagnoses:  Nonintractable headache, unspecified chronicity pattern, unspecified headache type    Nonspecific headache and nausea. Doubt serious bacterial infection, metabolic instability or impending vascular collapse.  Nursing Notes Reviewed/ Care Coordinated Applicable Imaging Reviewed Interpretation of Laboratory Data incorporated into ED treatment  The patient appears reasonably screened and/or stabilized for discharge and I doubt any other medical condition or other [facility] requiring further screening, evaluation, or treatment in the ED at this time prior to discharge.  Plan: Home Medications- continue; Home Treatments- rest, fluids; return here if the recommended treatment, does not improve the symptoms; Recommended follow up- PCP prn     New Prescriptions New Prescriptions  No medications on file   I personally performed the services described in this documentation, which was scribed in my presence. The recorded information has been reviewed and is accurate.     Mancel Bale, MD 10/23/15 0430

## 2015-10-23 NOTE — ED Notes (Signed)
Dr Wentz at bedside at this time 

## 2015-11-02 IMAGING — US US OB COMP LESS 14 WK
1 series · 14 of 28 positions shown · non-contrast
Comparison: None.

CLINICAL DATA: Abdominal pain.

EXAM:
OBSTETRIC <14 WK ULTRASOUND
TECHNIQUE: Transabdominal ultrasound was performed for evaluation of the
gestation as well as the maternal uterus and adnexal regions.

[Series 1: us ob comp less 14 wk · 0.22mm/px · 43 acquisitions, 14 frames shown]
[im 2/43]
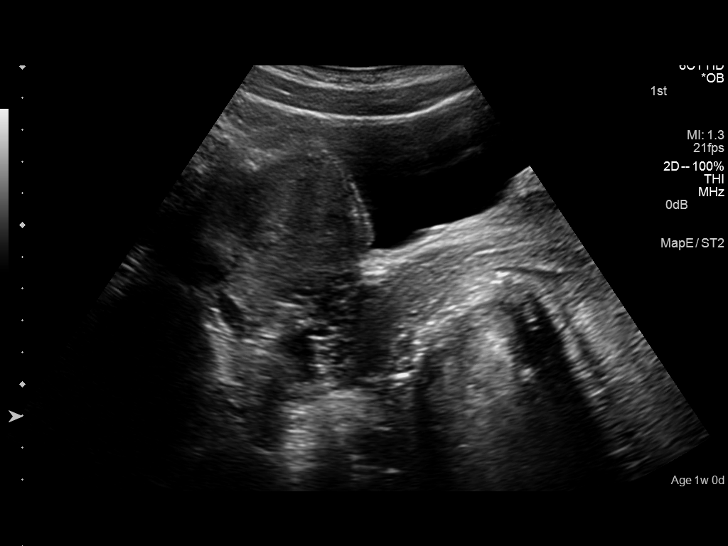
[im 5/43]
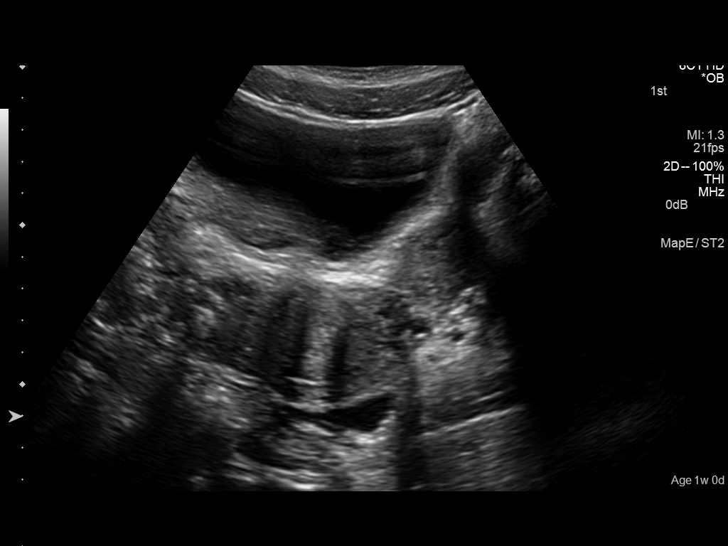
[im 8/43]
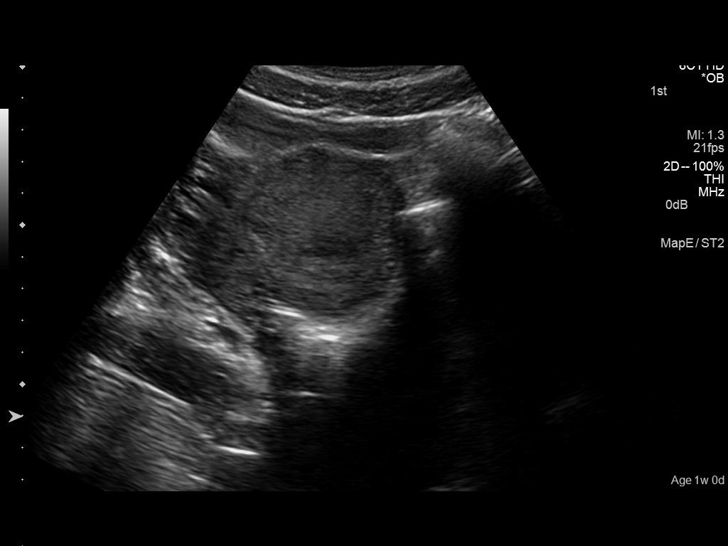
[im 11/43]
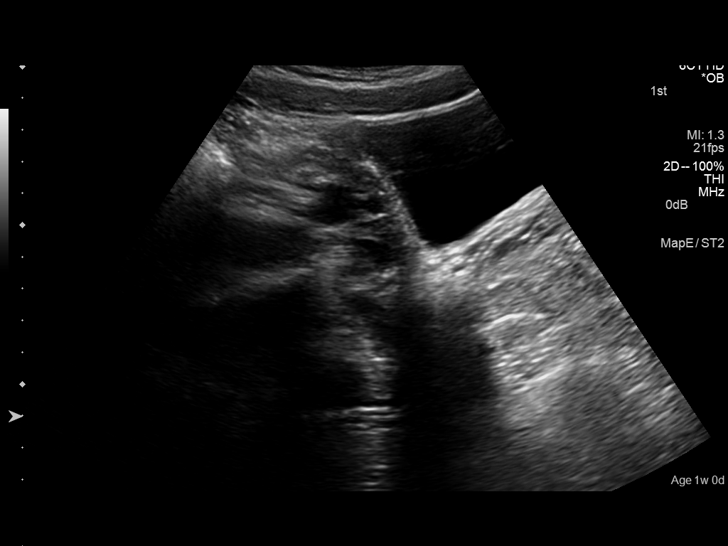
[im 15/43]
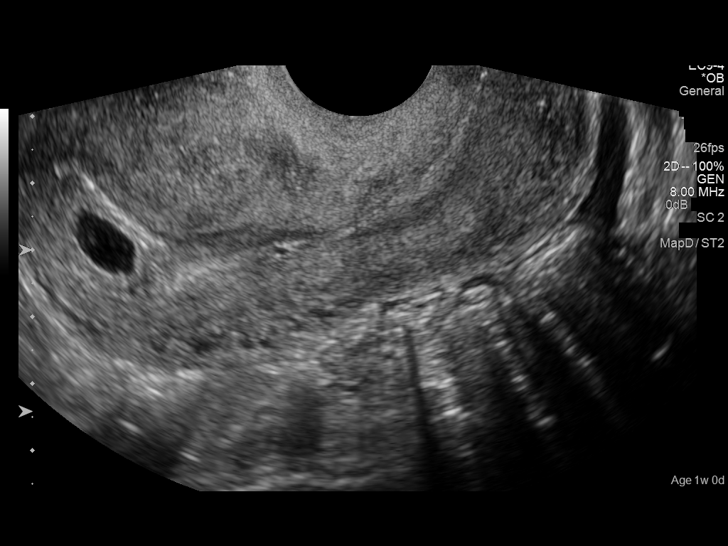
[im 18/43]
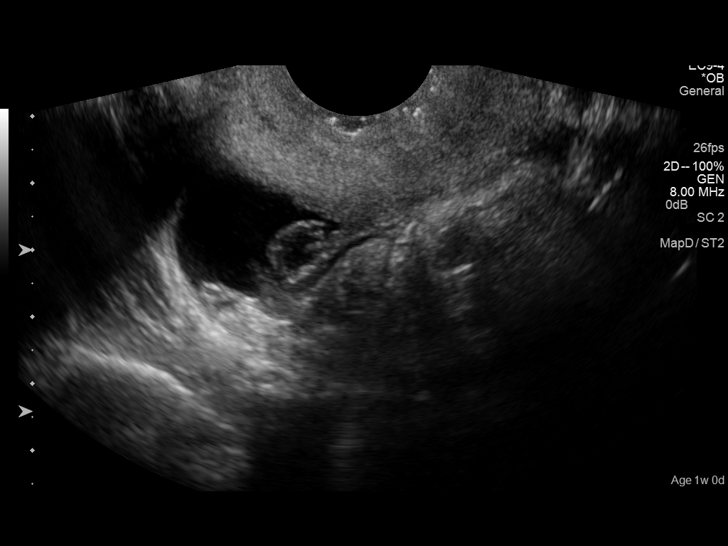
[im 21/43]
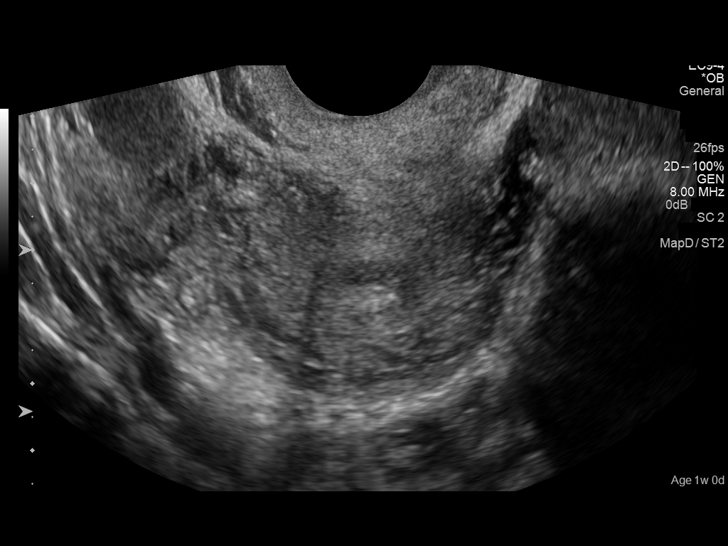
[im 24/43]
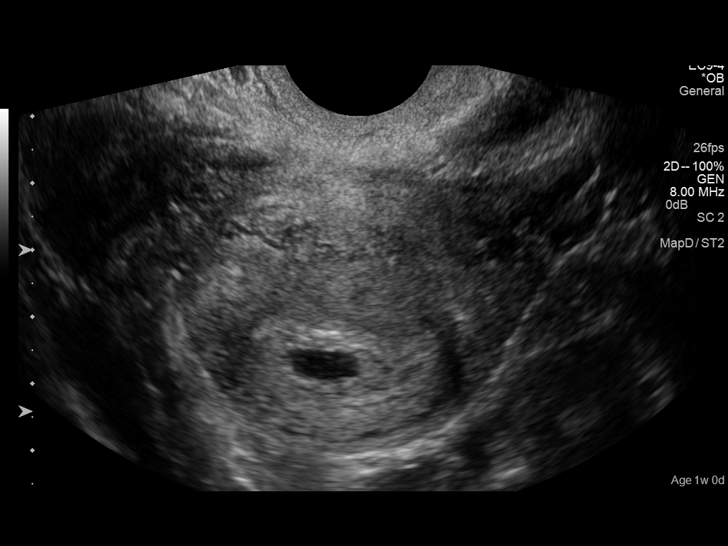
[im 27/43]
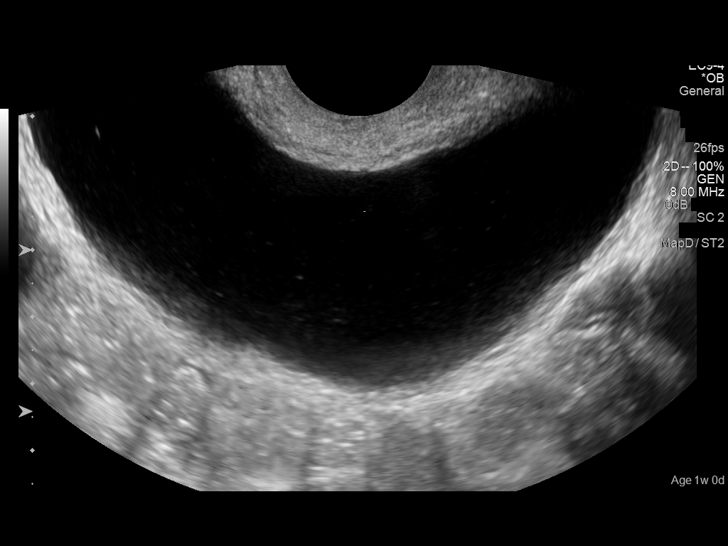
[im 30/43]
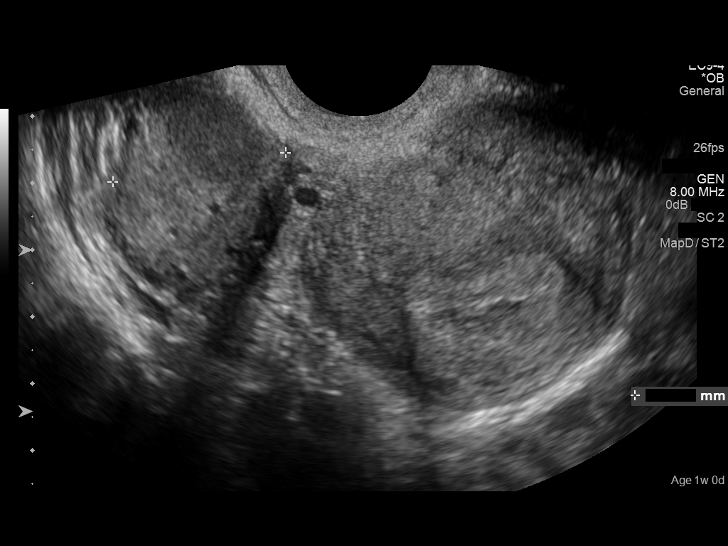
[im 33/43]
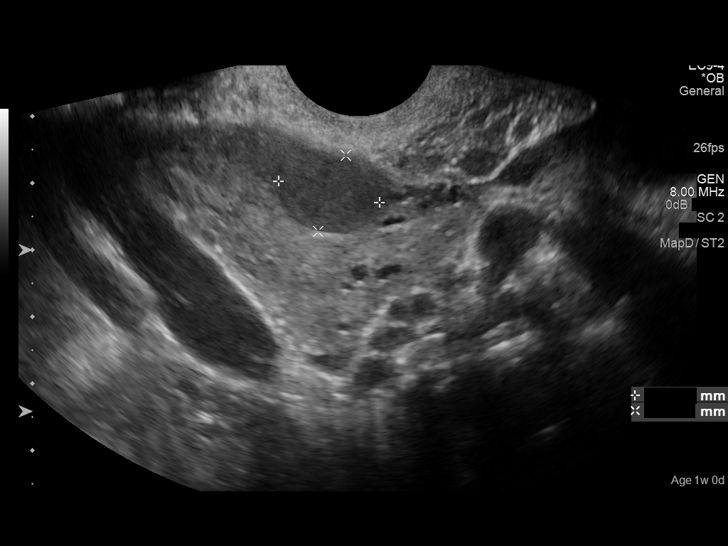
[im 36/43]
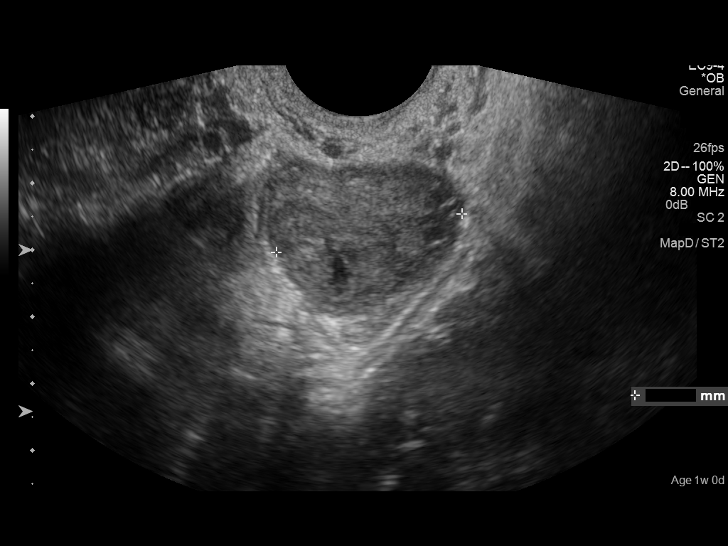
[im 39/43]
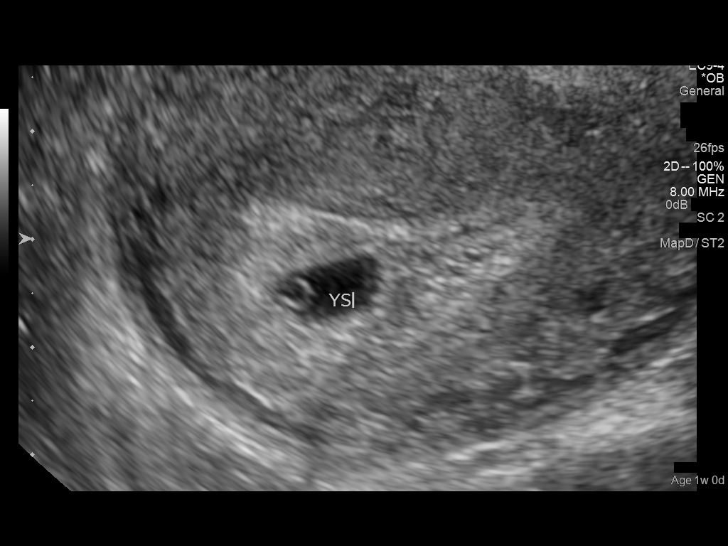
[im 43/43]
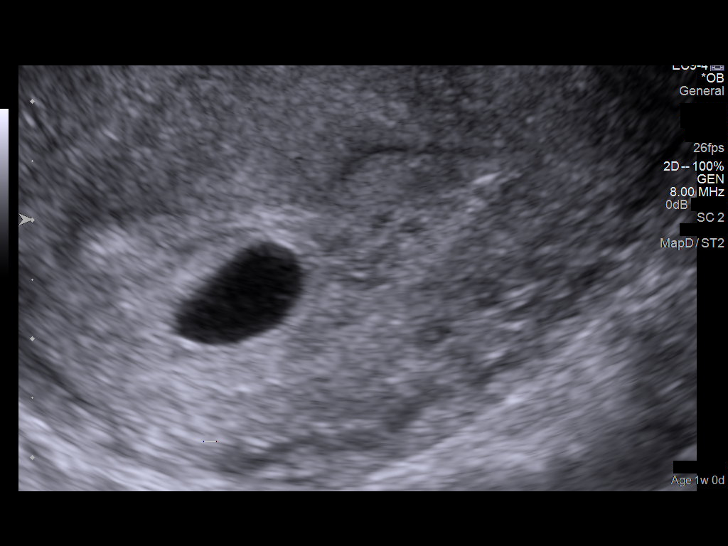

[14 of 28 positions shown; findings below may reference images not displayed]

FINDINGS: Intrauterine gestational sac: Visualized/normal in shape.

Yolk sac:  Visualized.

Embryo:  Not visualized.

Cardiac Activity: Not visualized.

MSD: 9.3  mm   5 w   5  d

Maternal uterus/adnexae: No subchorionic hemorrhage is noted.
Probable 1.8 cm hemorrhagic cyst is noted in right ovary. Probable
corpus luteum cyst seen in left ovary.
IMPRESSION: Probable early intrauterine gestational sac, but no fetal pole or
cardiac activity yet visualized. Recommend follow-up quantitative
B-HCG levels and follow-up US in 14 days to confirm and assess
viability. This recommendation follows SRU consensus guidelines:
Diagnostic Criteria for Nonviable Pregnancy Early in the First
Trimester. N Engl J Med 5536; [DATE].

## 2015-11-12 ENCOUNTER — Ambulatory Visit (INDEPENDENT_AMBULATORY_CARE_PROVIDER_SITE_OTHER): Payer: Medicaid Other | Admitting: Family Medicine

## 2015-11-12 ENCOUNTER — Encounter: Payer: Self-pay | Admitting: Family Medicine

## 2015-11-12 VITALS — BP 113/65 | HR 69 | Temp 98.1°F | Ht 65.0 in | Wt 136.4 lb

## 2015-11-12 DIAGNOSIS — G43009 Migraine without aura, not intractable, without status migrainosus: Secondary | ICD-10-CM | POA: Diagnosis not present

## 2015-11-12 DIAGNOSIS — G43001 Migraine without aura, not intractable, with status migrainosus: Secondary | ICD-10-CM | POA: Diagnosis present

## 2015-11-12 MED ORDER — DIPHENHYDRAMINE HCL 25 MG PO CAPS: 25.0000 mg | ORAL_CAPSULE | Freq: Three times a day (TID) | ORAL | 0 refills | Status: DC | PRN

## 2015-11-12 MED ORDER — DEXAMETHASONE SODIUM PHOSPHATE 100 MG/10ML IJ SOLN
10.0000 mg | Freq: Once | INTRAMUSCULAR | Status: AC
  Administered 2015-11-12: 10 mg via INTRAMUSCULAR

## 2015-11-12 MED ORDER — DEXAMETHASONE SODIUM PHOSPHATE 100 MG/10ML IJ SOLN: 20.0000 mg | Freq: Once | INTRAMUSCULAR | Status: DC

## 2015-11-12 MED ORDER — ONDANSETRON 4 MG PO TBDP: 4.0000 mg | ORAL_TABLET | Freq: Three times a day (TID) | ORAL | 0 refills | Status: DC | PRN

## 2015-11-12 MED ORDER — KETOROLAC TROMETHAMINE 30 MG/ML IJ SOLN
30.0000 mg | Freq: Once | INTRAMUSCULAR | Status: AC
  Administered 2015-11-12: 30 mg via INTRAMUSCULAR

## 2015-11-12 NOTE — Patient Instructions (Signed)
There is nice to see you. I do not see anything scary on exam. You've received at pain medication and a steroid today. When he got home, when you don't have to work, you can take 1 Zofran 4 mg and a Benadryl 25 mg. This will make you sleepy. Hopefully the combination of all of these will help with your headache. You can continue to take the Zofran as needed for nausea and vomiting. If your headaches or your abdominal pain worsens or fails to improve, please follow-up with us   Migraine Headache A migraine headache is an intense, throbbing pain on one or both sides of your head. A migraine can last for 30 minutes to several hours. CAUSES  The exact cause of a migraine headache is not always known. However, a migraine may be caused when nerves in the brain become irritated and release chemicals that cause inflammation. This causes pain. Certain things may also trigger migraines, such as:  Alcohol.  Smoking.  Stress.  Menstruation.  Aged cheeses.  Foods or drinks that contain nitrates, glutamate, aspartame, or tyramine.  Lack of sleep.  Chocolate.  Caffeine.  Hunger.  Physical exertion.  Fatigue.  Medicines used to treat chest pain (nitroglycerine), birth control pills, estrogen, and some blood pressure medicines. SIGNS AND SYMPTOMS  Pain on one or both sides of your head.  Pulsating or throbbing pain.  Severe pain that prevents daily activities.  Pain that is aggravated by any physical activity.  Nausea, vomiting, or both.  Dizziness.  Pain with exposure to bright lights, loud noises, or activity.  General sensitivity to bright lights, loud noises, or smells. Before you get a migraine, you may get warning signs that a migraine is coming (aura). An aura may include:  Seeing flashing lights.  Seeing bright spots, halos, or zigzag lines.  Having tunnel vision or blurred vision.  Having feelings of numbness or tingling.  Having trouble talking.  Having  muscle weakness. DIAGNOSIS  A migraine headache is often diagnosed based on:  Symptoms.  Physical exam.  A CT scan or MRI of your head. These imaging tests cannot diagnose migraines, but they can help rule out other causes of headaches. TREATMENT Medicines may be given for pain and nausea. Medicines can also be given to help prevent recurrent migraines.  HOME CARE INSTRUCTIONS  Only take over-the-counter or prescription medicines for pain or discomfort as directed by your health care provider. The use of long-term narcotics is not recommended.  Lie down in a dark, quiet room when you have a migraine.  Keep a journal to find out what may trigger your migraine headaches. For example, write down:  What you eat and drink.  How much sleep you get.  Any change to your diet or medicines.  Limit alcohol consumption.  Quit smoking if you smoke.  Get 7-9 hours of sleep, or as recommended by your health care provider.  Limit stress.  Keep lights dim if bright lights bother you and make your migraines worse. SEEK IMMEDIATE MEDICAL CARE IF:   Your migraine becomes severe.  You have a fever.  You have a stiff neck.  You have vision loss.  You have muscular weakness or loss of muscle control.  You start losing your balance or have trouble walking.  You feel faint or pass out.  You have severe symptoms that are different from your first symptoms. MAKE SURE YOU:   Understand these instructions.  Will watch your condition.  Will get help right away  if you are not doing well or get worse.   This information is not intended to replace advice given to you by your health care provider. Make sure you discuss any questions you have with your health care provider.   Document Released: 01/04/2005 Document Revised: 01/25/2014 Document Reviewed: 09/11/2012 Elsevier Interactive Patient Education [employer].

## 2015-11-12 NOTE — Progress Notes (Signed)
    Subjective: ZO:XWRUEAVWCC:migraine HPI: Patient is a 20 y.o. female presenting to clinic today for a SDA.  Migraine: started last night. The whole L side of her face is throbbing. She notes this has been intermittently been happening over the last few weeks. Before this she would intermittently have migraines over the years.  Endorses mild photophobia. No worsening with sound. Some worsening with movement. Lying down makes it worse. No change in vision.  Started having nausea with migraines. Vomited last night and this morning. No unilateral weakness or change in sensation. No rash noted.  No fevers or chills.   No family h/o migraines.   Social History: smokes daily, at least 3 cigs/day  Health Maintenance: Declined flu vaccine.  ROS: All other systems reviewed and are negative: Now she's having abdominal pain, cramps that started last night. Cramps are intermittent. Nothing makes it better or worse. No change with eating or BMs. Normal BMs.   Past Medical History Patient Active Problem List   Diagnosis Date Noted  . Migraine 11/14/2015  . Depression 04/21/2014  . DECREASED HEARING, BILATERAL 04/22/2009  . ECZEMA 01/06/2009  . ASTHMA, UNSPECIFIED 03/17/2006    Medications- reviewed and updated  Objective: Office vital signs reviewed. BP 113/65 (BP Location: Left Arm, Patient Position: Sitting, Cuff Size: Normal)   Pulse 69   Temp 98.1 F (36.7 C) (Oral)   Ht 5\' 5"  (1.651 m)   Wt 136 lb 6.4 oz (61.9 kg)   LMP 10/22/2015 (Exact Date)   BMI 22.70 kg/m    Physical Examination:  General: Awake, alert, well- nourished, NAD ENMT:  TMs intact, normal light reflex, no erythema, no bulging. Nasal turbinates moist. MMM, Oropharynx clear without erythema or tonsillar exudate/hypertrophy Eyes: Conjunctiva non-injected. PERRL. Normal fundoscopic exam.  Neuro: A&O x4. Speech clear. EOMI, Uvula and tongue midline. Facial movements symmetric. 5/5 strength in the upper extremities and lower  extremities bilaterally. Sensation intact bilaterally. Normal DTRs.    Assessment/Plan: Migraine The patient's presenting with what sounds like a typical migraine without aura. There are no red flags on exam or history. She does not have any neurologic deficits. This test trying to identify triggers such as lack of caffeine, hydration, or eating. -Patient drove here so we were cautious in treating her symptoms here. -Received Decadron 10 mg and Toradol 30 mg IM here in clinic as part of a migraine cocktail. -Patient prescribed Benadryl 25 mg and Zofran 4 mg is again tablets to take when she gets home and it will be okay if she is drowsy. -Discussed return precautions for the patient -In the future, patient continues to have frequent migraines, she may require prophylaxis.   No orders of the defined types were placed in this encounter.   Meds ordered this encounter  Medications  . DISCONTD: dexamethasone (DECADRON) injection 20 mg  . ketorolac (TORADOL) 30 MG/ML injection 30 mg  . ondansetron (ZOFRAN ODT) 4 MG disintegrating tablet    Sig: Take 1 tablet (4 mg total) by mouth every 8 (eight) hours as needed for nausea or vomiting.    Dispense:  20 tablet    Refill:  0  . diphenhydrAMINE (BENADRYL) 25 mg capsule    Sig: Take 1 capsule (25 mg total) by mouth every 8 (eight) hours as needed for allergies.    Dispense:  30 capsule    Refill:  0  . dexamethasone (DECADRON) injection 10 mg    Joanna Puffrystal S. Tonishia Steffy PGY-3, [facility]

## 2015-11-14 DIAGNOSIS — G43909 Migraine, unspecified, not intractable, without status migrainosus: Secondary | ICD-10-CM | POA: Insufficient documentation

## 2015-11-14 NOTE — Assessment & Plan Note (Signed)
The patient's presenting with what sounds like a typical migraine without aura. There are no red flags on exam or history. She does not have any neurologic deficits. This test trying to identify triggers such as lack of caffeine, hydration, or eating. -Patient drove here so we were cautious in treating her symptoms here. -Received Decadron 10 mg and Toradol 30 mg IM here in clinic as part of a migraine cocktail. -Patient prescribed Benadryl 25 mg and Zofran 4 mg is again tablets to take when she gets home and it will be okay if she is drowsy. -Discussed return precautions for the patient -In the future, patient continues to have frequent migraines, she may require prophylaxis.

## 2017-01-18 NOTE — L&D Delivery Note (Signed)
Delivery Note Pt became complete at 1201 after IOL due to PROM @ 1500 8/15. She had cytotec, cervical foley, and Pit as induction methods. She pushed well and at 1:34 PM a viable female was delivered via Vaginal, Spontaneous (Presentation: LOA ).  APGAR: 7, 8; weight: pending. Infant dried and placed on pt's abd; cord clamped and cut by FOB; hospital cord blood sample collected.   Placenta status: spont , intact .  Cord: 3 vessel  Anesthesia:  Epidural Episiotomy: None Lacerations: 1st degree, R labial- single suture Suture Repair: 3.0 vicryl Est. Blood Loss (mL): 400  Mom to postpartum.  Baby to Couplet care / Skin to Skin.  Cam HaiSHAW, Jennamarie Goings CNM 09/02/2017, 2:22 PM  Please schedule this patient for Postpartum visit in: 4 weeks with the following provider: Any provider For C/S patients schedule nurse incision check in weeks 2 weeks: no Low risk pregnancy complicated by: none Delivery mode:  SVD Anticipated Birth Control:  Condoms PP Procedures needed: none  Schedule Integrated BH visit: no

## 2017-01-22 ENCOUNTER — Encounter (HOSPITAL_COMMUNITY): Payer: Self-pay | Admitting: Emergency Medicine

## 2017-01-22 ENCOUNTER — Emergency Department (HOSPITAL_COMMUNITY): Payer: Managed Care, Other (non HMO)

## 2017-01-22 ENCOUNTER — Other Ambulatory Visit: Payer: Self-pay

## 2017-01-22 ENCOUNTER — Emergency Department (HOSPITAL_COMMUNITY)
Admission: EM | Admit: 2017-01-22 | Discharge: 2017-01-22 | Disposition: A | Discharge status: Home / Self Care | Payer: Managed Care, Other (non HMO) | Attending: Emergency Medicine | Admitting: Emergency Medicine

## 2017-01-22 DIAGNOSIS — O99331 Smoking (tobacco) complicating pregnancy, first trimester: Secondary | ICD-10-CM | POA: Diagnosis not present

## 2017-01-22 DIAGNOSIS — R1084 Generalized abdominal pain: Secondary | ICD-10-CM | POA: Diagnosis not present

## 2017-01-22 DIAGNOSIS — F17210 Nicotine dependence, cigarettes, uncomplicated: Secondary | ICD-10-CM | POA: Insufficient documentation

## 2017-01-22 DIAGNOSIS — O26891 Other specified pregnancy related conditions, first trimester: Secondary | ICD-10-CM | POA: Insufficient documentation

## 2017-01-22 DIAGNOSIS — J45909 Unspecified asthma, uncomplicated: Secondary | ICD-10-CM | POA: Diagnosis not present

## 2017-01-22 DIAGNOSIS — O219 Vomiting of pregnancy, unspecified: Secondary | ICD-10-CM | POA: Insufficient documentation

## 2017-01-22 DIAGNOSIS — R109 Unspecified abdominal pain: Secondary | ICD-10-CM

## 2017-01-22 DIAGNOSIS — Z79899 Other long term (current) drug therapy: Secondary | ICD-10-CM | POA: Insufficient documentation

## 2017-01-22 DIAGNOSIS — N83291 Other ovarian cyst, right side: Secondary | ICD-10-CM | POA: Insufficient documentation

## 2017-01-22 DIAGNOSIS — N83201 Unspecified ovarian cyst, right side: Secondary | ICD-10-CM

## 2017-01-22 LAB — URINALYSIS, ROUTINE W REFLEX MICROSCOPIC
Bilirubin Urine: NEGATIVE
Glucose, UA: NEGATIVE mg/dL
Hgb urine dipstick: NEGATIVE
Ketones, ur: 5 mg/dL — AB
LEUKOCYTES UA: NEGATIVE
Nitrite: NEGATIVE
PROTEIN: NEGATIVE mg/dL
SPECIFIC GRAVITY, URINE: 1.026 (ref 1.005–1.030)
pH: 6 (ref 5.0–8.0)

## 2017-01-22 LAB — COMPREHENSIVE METABOLIC PANEL
ALT: 13 U/L — AB (ref 14–54)
ANION GAP: 11 (ref 5–15)
AST: 20 U/L (ref 15–41)
Albumin: 4.2 g/dL (ref 3.5–5.0)
Alkaline Phosphatase: 47 U/L (ref 38–126)
BUN: 11 mg/dL (ref 6–20)
CALCIUM: 9.5 mg/dL (ref 8.9–10.3)
CHLORIDE: 100 mmol/L — AB (ref 101–111)
CO2: 25 mmol/L (ref 22–32)
CREATININE: 0.7 mg/dL (ref 0.44–1.00)
Glucose, Bld: 91 mg/dL (ref 65–99)
Potassium: 3.2 mmol/L — ABNORMAL LOW (ref 3.5–5.1)
SODIUM: 136 mmol/L (ref 135–145)
Total Bilirubin: 0.7 mg/dL (ref 0.3–1.2)
Total Protein: 7.5 g/dL (ref 6.5–8.1)

## 2017-01-22 LAB — CBC
HCT: 40.6 % (ref 36.0–46.0)
HEMOGLOBIN: 13.4 g/dL (ref 12.0–15.0)
MCH: 27.2 pg (ref 26.0–34.0)
MCHC: 33 g/dL (ref 30.0–36.0)
MCV: 82.5 fL (ref 78.0–100.0)
PLATELETS: 262 10*3/uL (ref 150–400)
RBC: 4.92 MIL/uL (ref 3.87–5.11)
RDW: 13.5 % (ref 11.5–15.5)
WBC: 5.6 10*3/uL (ref 4.0–10.5)

## 2017-01-22 LAB — WET PREP, GENITAL
Sperm: NONE SEEN
Trich, Wet Prep: NONE SEEN
YEAST WET PREP: NONE SEEN

## 2017-01-22 LAB — I-STAT BETA HCG BLOOD, ED (MC, WL, AP ONLY)

## 2017-01-22 LAB — LIPASE, BLOOD: LIPASE: 29 U/L (ref 11–51)

## 2017-01-22 LAB — HCG, QUANTITATIVE, PREGNANCY: hCG, Beta Chain, Quant, S: 18278 m[IU]/mL — ABNORMAL HIGH (ref <5)

## 2017-01-22 MED ORDER — ONDANSETRON 4 MG PO TBDP
4.0000 mg | ORAL_TABLET | Freq: Once | ORAL | Status: AC | PRN
  Administered 2017-01-22: 4 mg via ORAL
  Filled 2017-01-22: qty 1

## 2017-01-22 NOTE — ED Notes (Signed)
Patient transported to Ultrasound 

## 2017-01-22 NOTE — ED Provider Notes (Signed)
[facility] EMERGENCY DEPARTMENT Provider Note   CSN: [id number] Arrival date & time: 01/22/17  0134     History   Chief Complaint Chief Complaint  Patient presents with  . Abdominal Pain    HPI Cassidy Meyer is a 22 y.o. female.  Patient presents to the emergency department for evaluation of intermittent abdominal pain.  Symptoms began yesterday.  She reports that she is experiencing intermittent stabbing pain that is associated with nausea and vomiting.  She has not had diarrhea or fever.  Patient denies urinary symptoms.  She has not had any vaginal bleeding or discharge.      Past Medical History:  Diagnosis Date  . Asthma   . Bronchitis     Patient Active Problem List   Diagnosis Date Noted  . Migraine 11/14/2015  . Depression 04/21/2014  . DECREASED HEARING, BILATERAL 04/22/2009  . ECZEMA 01/06/2009  . ASTHMA, UNSPECIFIED 03/17/2006    Past Surgical History:  Procedure Laterality Date  . APPENDECTOMY    . HERNIA REPAIR      OB History    Gravida Para Term Preterm AB Living   3 0     2     SAB TAB Ectopic Multiple Live Births         0         Home Medications    Prior to Admission medications   Medication Sig Start Date End Date Taking? Authorizing Provider  diphenhydrAMINE (BENADRYL) 25 mg capsule Take 1 capsule (25 mg total) by mouth every 8 (eight) hours as needed for allergies. 11/12/15  Yes Joanna Puff, MD  ondansetron (ZOFRAN ODT) 4 MG disintegrating tablet Take 1 tablet (4 mg total) by mouth every 8 (eight) hours as needed for nausea or vomiting. 11/12/15  Yes Joanna Puff, MD    Family History No family history on file.  Social History Social History   Tobacco Use  . Smoking status: Current Every Day Smoker    Types: Cigarettes  . Smokeless tobacco: Never Used  Substance Use Topics  . Alcohol use: No  . Drug use: No     Allergies   Patient has no known allergies.   Review of Systems Review  of Systems  Gastrointestinal: Positive for abdominal pain, nausea and vomiting.  All other systems reviewed and are negative.    Physical Exam Updated Vital Signs BP (!) 116/57   Pulse 83   Temp 98.6 F (37 C) (Oral)   Resp 16   Ht 5\' 4"  (1.626 m)   Wt 59 kg (130 lb)   SpO2 99%   BMI 22.31 kg/m   Physical Exam  Constitutional: She is oriented to person, place, and time. She appears well-developed and well-nourished. No distress.  HENT:  Head: Normocephalic and atraumatic.  Right Ear: Hearing normal.  Left Ear: Hearing normal.  Nose: Nose normal.  Mouth/Throat: Oropharynx is clear and moist and mucous membranes are normal.  Eyes: Conjunctivae and EOM are normal. Pupils are equal, round, and reactive to light.  Neck: Normal range of motion. Neck supple.  Cardiovascular: Regular rhythm, S1 normal and S2 normal. Exam reveals no gallop and no friction rub.  No murmur heard. Pulmonary/Chest: Effort normal and breath sounds normal. No respiratory distress. She exhibits no tenderness.  Abdominal: Soft. Normal appearance and bowel sounds are normal. There is no hepatosplenomegaly. There is no tenderness. There is no rebound, no guarding, no tenderness at McBurney's point and negative Murphy's sign. No hernia.  Genitourinary: Vagina normal. Uterus is not tender. Cervix exhibits no motion tenderness and no friability. Right adnexum displays tenderness. Right adnexum displays no mass. Left adnexum displays no mass.  Musculoskeletal: Normal range of motion.  Neurological: She is alert and oriented to person, place, and time. She has normal strength. No cranial nerve deficit or sensory deficit. Coordination normal. GCS eye subscore is 4. GCS verbal subscore is 5. GCS motor subscore is 6.  Skin: Skin is warm, dry and intact. No rash noted. No cyanosis.  Psychiatric: She has a normal mood and affect. Her speech is normal and behavior is normal. Thought content normal.  Nursing note and vitals  reviewed.    ED Treatments / Results  Labs (all labs ordered are listed, but only abnormal results are displayed) Labs Reviewed  COMPREHENSIVE METABOLIC PANEL - Abnormal; Notable for the following components:      Result Value   Potassium 3.2 (*)    Chloride 100 (*)    ALT 13 (*)    All other components within normal limits  URINALYSIS, ROUTINE W REFLEX MICROSCOPIC - Abnormal; Notable for the following components:   APPearance HAZY (*)    Ketones, ur 5 (*)    All other components within normal limits  HCG, QUANTITATIVE, PREGNANCY - Abnormal; Notable for the following components:   hCG, Beta Chain, Quant, S 18,278 (*)    All other components within normal limits  I-STAT BETA HCG BLOOD, ED ([facility], [facility], [facility] ONLY) - Abnormal; Notable for the following components:   I-stat hCG, quantitative >2,000.0 (*)    All other components within normal limits  LIPASE, BLOOD  CBC    EKG  EKG Interpretation None       Radiology [place] Ob Comp Less 14 Wks  Result Date: 01/22/2017 CLINICAL DATA:  Pregnant patient in first-trimester pregnancy with abdominopelvic pain. EXAM: OBSTETRIC <14 WK US AND TRANSVAGINAL OB US TECHNIQUE: Both transabdominal and transvaginal ultrasound examinations were performed for complete evaluation of the gestation as well as the maternal uterus, adnexal regions, and pelvic cul-de-sac. Transvaginal technique was performed to assess early pregnancy. COMPARISON:  None this pregnancy. FINDINGS: Intrauterine gestational sac: Single Yolk sac:  Visualized. Embryo:  Visualized. Cardiac Activity: Visualized. Heart Rate: 120  bpm CRL:  5.4  mm   6 w   2 d                  US EDC: 09/15/2016 Subchorionic hemorrhage:  None visualized. Maternal uterus/adnexae: Complex cyst in the right ovary measures 2.9 x 2.3 x 2.9 cm with internal septations, some of which appear thickened or nodular. Normal ovarian blood flow. Left ovary is normal in size in appearance. No pelvic free fluid. IMPRESSION: 1.  Single live intrauterine gestation estimated gestational age [redacted] weeks 2 days by crown-rump length for estimated date of delivery 09/15/2016. No subchorionic hemorrhage. 2. Complex right ovarian cyst measuring 2.9 cm with internal septations, some which are thickened or possibly nodular. While this may reflect a resolving hemorrhagic cyst, recommend attention on follow-up to resolution to exclude cystic ovarian neoplasm. Electronically Signed   By: Rubye OaksMelanie  Ehinger M.D.   On: 01/22/2017 06:04   [place] Ob Transvaginal  Result Date: 01/22/2017 CLINICAL DATA:  Pregnant patient in first-trimester pregnancy with abdominopelvic pain. EXAM: OBSTETRIC <14 WK US AND TRANSVAGINAL OB US TECHNIQUE: Both transabdominal and transvaginal ultrasound examinations were performed for complete evaluation of the gestation as well as the maternal uterus, adnexal regions, and pelvic cul-de-sac. Transvaginal technique was  performed to assess early pregnancy. COMPARISON:  None this pregnancy. FINDINGS: Intrauterine gestational sac: Single Yolk sac:  Visualized. Embryo:  Visualized. Cardiac Activity: Visualized. Heart Rate: 120  bpm CRL:  5.4  mm   6 w   2 d                  [place] EDC: 09/15/2016 Subchorionic hemorrhage:  None visualized. Maternal uterus/adnexae: Complex cyst in the right ovary measures 2.9 x 2.3 x 2.9 cm with internal septations, some of which appear thickened or nodular. Normal ovarian blood flow. Left ovary is normal in size in appearance. No pelvic free fluid. IMPRESSION: 1. Single live intrauterine gestation estimated gestational age [redacted] weeks 2 days by crown-rump length for estimated date of delivery 09/15/2016. No subchorionic hemorrhage. 2. Complex right ovarian cyst measuring 2.9 cm with internal septations, some which are thickened or possibly nodular. While this may reflect a resolving hemorrhagic cyst, recommend attention on follow-up to resolution to exclude cystic ovarian neoplasm. Electronically Signed   By: Rubye Oaks M.D.   On: 01/22/2017 06:04    Procedures Procedures (including critical care time)  Medications Ordered in ED Medications  ondansetron (ZOFRAN-ODT) disintegrating tablet 4 mg (4 mg Oral Given 01/22/17 0143)     Initial Impression / Assessment and Plan / ED Course  I have reviewed the triage vital signs and the nursing notes.  Pertinent labs & imaging results that were available during my care of the patient were reviewed by me and considered in my medical decision making (see chart for details).     Patient presents for evaluation of nausea, vomiting and diffuse abdominal pain.  Patient has benign abdominal exam on examination.  No signs of peritonitis or acute surgical process.  Patient found to be pregnant at arrival.  She is not expensing any vaginal bleeding or discharge.  Ultrasound confirms intrauterine pregnancy.  She also has a complex cyst on the right adnexa.  Pelvic exam did not reveal any acute pathology.  Patient was counseled on the ultrasound findings including cyst, understands that she needs to follow-up with OB/GYN for the cyst as well as prenatal care.  She will be given a referral.  Final Clinical Impressions(s) / ED Diagnoses   Final diagnoses:  Cyst of right ovary  Abdominal pain during pregnancy in first trimester    ED Discharge Orders    None       Trayvond Viets, Canary Brim, MD 01/22/17 [phone]

## 2017-01-22 NOTE — Discharge Instructions (Signed)
Follow-up with OB/GYN to have prenatal care for your baby as well as to have the ovarian cyst evaluated.  Start taking prenatal vitamins.  Use Tylenol as needed for pain.

## 2017-01-22 NOTE — ED Notes (Signed)
Pt reports gen abd pain present since yesterday, intermittent, 5/10, stabbing in nature. Reports N/V, denies diarrhea.

## 2017-01-24 LAB — GC/CHLAMYDIA PROBE AMP (~~LOC~~) NOT AT ARMC
CHLAMYDIA, DNA PROBE: NEGATIVE
Neisseria Gonorrhea: NEGATIVE

## 2017-02-11 ENCOUNTER — Encounter: Payer: Managed Care, Other (non HMO) | Admitting: Obstetrics and Gynecology

## 2017-02-14 ENCOUNTER — Encounter: Payer: Self-pay | Admitting: Obstetrics & Gynecology

## 2017-02-14 ENCOUNTER — Ambulatory Visit (INDEPENDENT_AMBULATORY_CARE_PROVIDER_SITE_OTHER): Payer: Managed Care, Other (non HMO) | Admitting: Obstetrics & Gynecology

## 2017-02-14 ENCOUNTER — Other Ambulatory Visit (HOSPITAL_COMMUNITY)
Admission: RE | Admit: 2017-02-14 | Discharge: 2017-02-14 | Disposition: A | Discharge status: Home / Self Care | Payer: Managed Care, Other (non HMO) | Source: Ambulatory Visit | Attending: Obstetrics & Gynecology | Admitting: Obstetrics & Gynecology

## 2017-02-14 VITALS — BP 129/64 | HR 83

## 2017-02-14 DIAGNOSIS — Z3491 Encounter for supervision of normal pregnancy, unspecified, first trimester: Secondary | ICD-10-CM

## 2017-02-14 DIAGNOSIS — Z3490 Encounter for supervision of normal pregnancy, unspecified, unspecified trimester: Secondary | ICD-10-CM

## 2017-02-14 DIAGNOSIS — H918X3 Other specified hearing loss, bilateral: Secondary | ICD-10-CM

## 2017-02-14 DIAGNOSIS — Z3480 Encounter for supervision of other normal pregnancy, unspecified trimester: Secondary | ICD-10-CM | POA: Insufficient documentation

## 2017-02-14 DIAGNOSIS — Z3481 Encounter for supervision of other normal pregnancy, first trimester: Secondary | ICD-10-CM

## 2017-02-14 DIAGNOSIS — O99519 Diseases of the respiratory system complicating pregnancy, unspecified trimester: Secondary | ICD-10-CM | POA: Insufficient documentation

## 2017-02-14 DIAGNOSIS — O99330 Smoking (tobacco) complicating pregnancy, unspecified trimester: Secondary | ICD-10-CM | POA: Insufficient documentation

## 2017-02-14 DIAGNOSIS — O99320 Drug use complicating pregnancy, unspecified trimester: Secondary | ICD-10-CM

## 2017-02-14 DIAGNOSIS — O99511 Diseases of the respiratory system complicating pregnancy, first trimester: Secondary | ICD-10-CM

## 2017-02-14 DIAGNOSIS — O99331 Smoking (tobacco) complicating pregnancy, first trimester: Secondary | ICD-10-CM

## 2017-02-14 DIAGNOSIS — O99321 Drug use complicating pregnancy, first trimester: Secondary | ICD-10-CM

## 2017-02-14 DIAGNOSIS — J45909 Unspecified asthma, uncomplicated: Secondary | ICD-10-CM

## 2017-02-14 NOTE — Progress Notes (Signed)
  Subjective:    Cassidy GardnerJessica Meyer is being seen today for her first obstetrical visit.  LMP unsure Oct or Nov. This is not a planned pregnancy. But, pt and partner are happy. P She is at 3450w4d gestation. Her obstetrical history is significant for smoker. Relationship with FOB: significant other, living together. Patient does intend to breast feed. Pregnancy history fully reviewed. Pt tob history and THC   Patient reports no complaints.  Review of Systems:   Review of Systems  Objective:     BP 129/64   Pulse 83   LMP 12/15/2016 (Within Weeks)  Physical Exam  Exam General Appearance:    Alert, cooperative, no distress, appears stated age  Head:    Normocephalic, without obvious abnormality, atraumatic  Eyes:    conjunctiva/corneas clear, EOM's intact, both eyes  Ears:    Normal external ear canals, both ears  Nose:   Nares normal, septum midline, mucosa normal, no drainage    or sinus tenderness  Throat:   Lips, mucosa, and tongue normal; teeth and gums normal  Neck:   Supple, symmetrical, trachea midline, no adenopathy;    thyroid:  no enlargement/tenderness/nodules  Back:     Symmetric, no curvature, ROM normal, no CVA tenderness  Lungs:     Clear to auscultation bilaterally, respirations unlabored  Chest Wall:    No tenderness or deformity   Heart:    Regular rate and rhythm, S1 and S2 normal, no murmur, rub   or gallop  Breast Exam:    No tenderness, masses, or nipple abnormality  Abdomen:     Soft, non-tender, bowel sounds active all four quadrants,    no masses, no organomegaly  Genitalia:    Normal female without lesion, discharge or tenderness     Extremities:   Extremities normal, atraumatic, no cyanosis or edema  Pulses:   2+ and symmetric all extremities  Skin:   Skin color, texture, turgor normal, no rashes or lesions multiple tattoos.      Assessment:    Pregnancy: [id number] Patient Active Problem List   Diagnosis Date Noted  . Supervision of other normal  pregnancy, antepartum 02/14/2017  . Migraine 11/14/2015  . Depression 04/21/2014  . DECREASED HEARING, BILATERAL 04/22/2009  . ECZEMA 01/06/2009  . ASTHMA, UNSPECIFIED 03/17/2006       Plan:     Initial labs drawn. Prenatal vitamins. Problem list reviewed and updated. AFP3 discussed: requested. Desires Panorama at next visit Role of ultrasound in pregnancy discussed; fetal survey: requested. Amniocentesis discussed: not indicated. Follow up in 4 weeks. 60% of 60 min visit spent on counseling and coordination of care.  PNV rec tob and THC cessation Pt will go to her primary care doc to get hearing rechecked.    Cassidy Meyer 02/14/2017

## 2017-02-14 NOTE — Patient Instructions (Signed)

## 2017-02-15 LAB — CYTOLOGY - PAP
Chlamydia: NEGATIVE
Diagnosis: NEGATIVE
Neisseria Gonorrhea: NEGATIVE

## 2017-02-16 LAB — OBSTETRIC PANEL, INCLUDING HIV
Antibody Screen: NEGATIVE
Basophils Absolute: 0 10*3/uL (ref 0.0–0.2)
Basos: 1 %
EOS (ABSOLUTE): 0.5 10*3/uL — AB (ref 0.0–0.4)
EOS: 8 %
HIV Screen 4th Generation wRfx: NONREACTIVE
Hematocrit: 37.4 % (ref 34.0–46.6)
Hemoglobin: 12.4 g/dL (ref 11.1–15.9)
Hepatitis B Surface Ag: NEGATIVE
IMMATURE GRANULOCYTES: 0 %
Immature Grans (Abs): 0 10*3/uL (ref 0.0–0.1)
Lymphocytes Absolute: 1.9 10*3/uL (ref 0.7–3.1)
Lymphs: 32 %
MCH: 28.4 pg (ref 26.6–33.0)
MCHC: 33.2 g/dL (ref 31.5–35.7)
MCV: 86 fL (ref 79–97)
MONOS ABS: 0.6 10*3/uL (ref 0.1–0.9)
Monocytes: 9 %
NEUTROS PCT: 50 %
Neutrophils Absolute: 3 10*3/uL (ref 1.4–7.0)
Platelets: 194 10*3/uL (ref 150–379)
RBC: 4.36 x10E6/uL (ref 3.77–5.28)
RDW: 14.5 % (ref 12.3–15.4)
RH TYPE: POSITIVE
RPR Ser Ql: NONREACTIVE
Rubella Antibodies, IGG: 4.23 index (ref >0.99)
WBC: 6.1 10*3/uL (ref 3.4–10.8)

## 2017-02-16 LAB — CULTURE, URINE COMPREHENSIVE

## 2017-02-16 LAB — HEMOGLOBINOPATHY EVALUATION
HGB C: 0 %
HGB S: 0 %
HGB VARIANT: 0 %
Hemoglobin A2 Quantitation: 2.4 % (ref 1.8–3.2)
Hemoglobin F Quantitation: 0 % (ref 0.0–2.0)
Hgb A: 97.6 % (ref 96.4–98.8)

## 2017-03-17 ENCOUNTER — Encounter: Payer: Self-pay | Admitting: Family Medicine

## 2017-03-17 ENCOUNTER — Ambulatory Visit (INDEPENDENT_AMBULATORY_CARE_PROVIDER_SITE_OTHER): Payer: Medicaid Other | Admitting: Family Medicine

## 2017-03-17 VITALS — BP 119/59 | HR 77 | Wt 141.0 lb

## 2017-03-17 DIAGNOSIS — Z3490 Encounter for supervision of normal pregnancy, unspecified, unspecified trimester: Secondary | ICD-10-CM

## 2017-03-17 DIAGNOSIS — Z3480 Encounter for supervision of other normal pregnancy, unspecified trimester: Secondary | ICD-10-CM

## 2017-03-17 DIAGNOSIS — Z3492 Encounter for supervision of normal pregnancy, unspecified, second trimester: Secondary | ICD-10-CM

## 2017-03-17 NOTE — Progress Notes (Signed)
   PRENATAL VISIT NOTE  Subjective:  Cassidy Meyer is a 22 y.o. G4P0020 at [redacted]w[redacted]d being seen today for ongoing prenatal care.  She is currently monitored for the following issues for this low-risk pregnancy and has Hearing loss; ECZEMA; Depression; Migraine; Supervision of other normal pregnancy, antepartum; Asthma affecting pregnancy, antepartum; and Tobacco smoking affecting pregnancy, antepartum on their problem list.  Patient reports no complaints.  Contractions: Not present. Vag. Bleeding: None.   . Denies leaking of fluid.   The following portions of the patient's history were reviewed and updated as appropriate: allergies, current medications, past family history, past medical history, past social history, past surgical history and problem list. Problem list updated.  Objective:   Vitals:   03/17/17 0952  BP: (!) 119/59  Pulse: 77  Weight: 141 lb (64 kg)    Fetal Status: Fetal Heart Rate (bpm): 150         General:  Alert, oriented and cooperative. Patient is in no acute distress.  Skin: Skin is warm and dry. No rash noted.   Cardiovascular: Normal heart rate noted  Respiratory: Normal respiratory effort, no problems with respiration noted  Abdomen: Soft, gravid, appropriate for gestational age.  Pain/Pressure: Present     Pelvic: Cervical exam deferred        Extremities: Normal range of motion.  Edema: None  Mental Status:  Normal mood and affect. Normal behavior. Normal judgment and thought content.   Assessment and Plan:  Pregnancy: G4P0020 at 561w0d  1. Supervision of other normal pregnancy, antepartum FHT and FH normal. Declined panorama - will wait for US.  2. Prenatal care, antepartum - US MFM OB DETAIL +14 WK; Future  Preterm labor symptoms and general obstetric precautions including but not limited to vaginal bleeding, contractions, leaking of fluid and fetal movement were reviewed in detail with the patient. Please refer to After Visit Summary for other  counseling recommendations.  Return in about 6 weeks (around 04/28/2017) for OB f/u.   Levie HeritageJacob J Stinson, DO

## 2017-04-14 ENCOUNTER — Encounter (HOSPITAL_COMMUNITY): Payer: Self-pay | Admitting: Family Medicine

## 2017-04-20 ENCOUNTER — Other Ambulatory Visit: Payer: Self-pay | Admitting: Family Medicine

## 2017-04-20 ENCOUNTER — Ambulatory Visit (HOSPITAL_COMMUNITY)
Admission: RE | Admit: 2017-04-20 | Discharge: 2017-04-20 | Disposition: A | Discharge status: Home / Self Care | Payer: Managed Care, Other (non HMO) | Source: Ambulatory Visit | Attending: Family Medicine | Admitting: Family Medicine

## 2017-04-20 DIAGNOSIS — Z3490 Encounter for supervision of normal pregnancy, unspecified, unspecified trimester: Secondary | ICD-10-CM

## 2017-04-20 DIAGNOSIS — Z3A18 18 weeks gestation of pregnancy: Secondary | ICD-10-CM | POA: Diagnosis not present

## 2017-04-20 DIAGNOSIS — Z363 Encounter for antenatal screening for malformations: Secondary | ICD-10-CM

## 2017-04-28 ENCOUNTER — Ambulatory Visit (INDEPENDENT_AMBULATORY_CARE_PROVIDER_SITE_OTHER): Payer: Medicaid Other | Admitting: Family Medicine

## 2017-04-28 ENCOUNTER — Encounter: Payer: Self-pay | Admitting: Family Medicine

## 2017-04-28 VITALS — BP 108/75 | HR 87 | Wt 152.0 lb

## 2017-04-28 DIAGNOSIS — K047 Periapical abscess without sinus: Secondary | ICD-10-CM

## 2017-04-28 DIAGNOSIS — Z3491 Encounter for supervision of normal pregnancy, unspecified, first trimester: Secondary | ICD-10-CM

## 2017-04-28 DIAGNOSIS — Z3490 Encounter for supervision of normal pregnancy, unspecified, unspecified trimester: Secondary | ICD-10-CM

## 2017-04-28 MED ORDER — AMOXICILLIN 500 MG PO CAPS: 500.0000 mg | ORAL_CAPSULE | Freq: Three times a day (TID) | ORAL | 2 refills | Status: DC

## 2017-04-28 MED ORDER — AMOXICILLIN 500 MG PO CAPS: 500.0000 mg | ORAL_CAPSULE | Freq: Three times a day (TID) | ORAL | 0 refills | Status: AC

## 2017-04-28 MED FILL — AMOXICILLIN 500 MG CAPSULE: 500 | 10 days supply | Qty: 30 | Fill #0

## 2017-04-28 NOTE — Addendum Note (Signed)
Addended by: Levie HeritageSTINSON, Broderic Bara J on: 04/28/2017 10:47 AM   Modules accepted: Orders

## 2017-04-28 NOTE — Progress Notes (Addendum)
   PRENATAL VISIT NOTE  Subjective:  Cassidy Meyer is a 22 y.o. G4P0020 at 3627w0d being seen today for ongoing prenatal care.  She is currently monitored for the following issues for this low-risk pregnancy and has Hearing loss; ECZEMA; Depression; Migraine; Supervision of other normal pregnancy, antepartum; Asthma affecting pregnancy, antepartum; and Tobacco smoking affecting pregnancy, antepartum on their problem list.  Patient reports round ligament pain. Has infected tooth. Contractions: Not present. Vag. Bleeding: None.  Movement: Present. Denies leaking of fluid.   The following portions of the patient's history were reviewed and updated as appropriate: allergies, current medications, past family history, past medical history, past social history, past surgical history and problem list. Problem list updated.  Objective:   Vitals:   04/28/17 1008  BP: 108/75  Pulse: 87  Weight: 152 lb (68.9 kg)    Fetal Status: Fetal Heart Rate (bpm): 150 Fundal Height: 20 cm Movement: Present     General:  Alert, oriented and cooperative. Patient is in no acute distress.  Skin: Skin is warm and dry. No rash noted.   Cardiovascular: Normal heart rate noted  Respiratory: Normal respiratory effort, no problems with respiration noted  Abdomen: Soft, gravid, appropriate for gestational age.  Pain/Pressure: Present     Pelvic: Cervical exam deferred        Extremities: Normal range of motion.  Edema: None  Mental Status: Normal mood and affect. Normal behavior. Normal judgment and thought content.   Assessment and Plan:  Pregnancy: G4P0020 at 7827w0d  1. Prenatal care, antepartum - US MFM OB FOLLOW UP; Future  2. Tooth infection Amoxicillin 500mg  TID x10 days  Preterm labor symptoms and general obstetric precautions including but not limited to vaginal bleeding, contractions, leaking of fluid and fetal movement were reviewed in detail with the patient. Please refer to After Visit Summary for  other counseling recommendations.  No follow-ups on file.  No future appointments.  Levie HeritageJacob J Jamyiah Labella, DO

## 2017-04-28 NOTE — Progress Notes (Signed)
.  mfm

## 2017-04-28 NOTE — Addendum Note (Signed)
Addended by: Anell BarrHOWARD, Latima Hamza L on: 04/28/2017 10:49 AM   Modules accepted: Orders

## 2017-05-12 ENCOUNTER — Inpatient Hospital Stay (HOSPITAL_COMMUNITY)
Admission: AD | Admit: 2017-05-12 | Discharge: 2017-05-12 | Disposition: A | Discharge status: Home / Self Care | Payer: Managed Care, Other (non HMO) | Source: Ambulatory Visit | Attending: Obstetrics & Gynecology | Admitting: Obstetrics & Gynecology

## 2017-05-12 ENCOUNTER — Encounter (HOSPITAL_COMMUNITY): Payer: Self-pay

## 2017-05-12 DIAGNOSIS — O26899 Other specified pregnancy related conditions, unspecified trimester: Secondary | ICD-10-CM | POA: Diagnosis not present

## 2017-05-12 DIAGNOSIS — O99332 Smoking (tobacco) complicating pregnancy, second trimester: Secondary | ICD-10-CM | POA: Diagnosis not present

## 2017-05-12 DIAGNOSIS — O99512 Diseases of the respiratory system complicating pregnancy, second trimester: Secondary | ICD-10-CM | POA: Diagnosis not present

## 2017-05-12 DIAGNOSIS — F17210 Nicotine dependence, cigarettes, uncomplicated: Secondary | ICD-10-CM | POA: Diagnosis not present

## 2017-05-12 DIAGNOSIS — N898 Other specified noninflammatory disorders of vagina: Secondary | ICD-10-CM | POA: Insufficient documentation

## 2017-05-12 DIAGNOSIS — O26892 Other specified pregnancy related conditions, second trimester: Secondary | ICD-10-CM | POA: Insufficient documentation

## 2017-05-12 DIAGNOSIS — Z79899 Other long term (current) drug therapy: Secondary | ICD-10-CM | POA: Diagnosis not present

## 2017-05-12 DIAGNOSIS — Z3A22 22 weeks gestation of pregnancy: Secondary | ICD-10-CM | POA: Diagnosis not present

## 2017-05-12 DIAGNOSIS — J45909 Unspecified asthma, uncomplicated: Secondary | ICD-10-CM | POA: Diagnosis not present

## 2017-05-12 DIAGNOSIS — R102 Pelvic and perineal pain: Secondary | ICD-10-CM | POA: Insufficient documentation

## 2017-05-12 LAB — URINALYSIS, ROUTINE W REFLEX MICROSCOPIC
Bilirubin Urine: NEGATIVE
Glucose, UA: NEGATIVE mg/dL
Hgb urine dipstick: NEGATIVE
Ketones, ur: 5 mg/dL — AB
LEUKOCYTES UA: NEGATIVE
Nitrite: NEGATIVE
PROTEIN: NEGATIVE mg/dL
Specific Gravity, Urine: 1.026 (ref 1.005–1.030)
pH: 6 (ref 5.0–8.0)

## 2017-05-12 NOTE — Discharge Instructions (Signed)

## 2017-05-12 NOTE — MAU Provider Note (Signed)
Chief Complaint:  Abdominal Pain   First Provider Initiated Contact with Patient 05/12/17 0258     HPI: Cassidy Meyer is a 22 y.o. G4P0020 at 28w0dwho presents to maternity admissions reporting sharp pain in lower abdomen after lifting a box at work.  After that felt wetness on underclothing. No bleeding. She reports good fetal movement, denies LOF, vaginal bleeding, vaginal itching/burning, urinary symptoms, h/a, dizziness, n/v, diarrhea, constipation or fever/chills.  .  Abdominal Pain  This is a new problem. The current episode started today. The onset quality is sudden. The problem occurs rarely. The problem has been resolved. The pain is located in the RLQ. The quality of the pain is sharp. The abdominal pain does not radiate. Pertinent negatives include no constipation, diarrhea, dysuria, fever, myalgias, nausea or vomiting. The pain is aggravated by movement and palpation. The pain is relieved by nothing. She has tried nothing for the symptoms.  Vaginal Discharge  The patient's primary symptoms include vaginal discharge. The patient's pertinent negatives include no genital itching, genital lesions, genital odor or vaginal bleeding. This is a new problem. The current episode started today. The problem occurs rarely. The problem has been resolved. The problem affects the right side. She is pregnant. Associated symptoms include abdominal pain. Pertinent negatives include no constipation, diarrhea, dysuria, fever, nausea or vomiting. The vaginal discharge was clear. There has been no bleeding. She has not been passing clots. She has not been passing tissue. Nothing aggravates the symptoms. She has tried nothing for the symptoms.   RN Note: Pt here with c/o cramping in lower abdomen. Lifted a box at work tonight and felt a sharp pain. Denies bleeding. Possibly leaking fluid, "underwear felt wet"   Past Medical History: Past Medical History:  Diagnosis Date  . Asthma   . Bronchitis     Past  obstetric history: OB History  Gravida Para Term Preterm AB Living  4 0     2    SAB TAB Ectopic Multiple Live Births        0      # Outcome Date GA Lbr Len/2nd Weight Sex Delivery Anes PTL Lv  4 Current           3 Gravida           2 AB           1 AB             Past Surgical History: Past Surgical History:  Procedure Laterality Date  . APPENDECTOMY    . HERNIA REPAIR      Family History: Family History  Problem Relation Age of Onset  . Hypertension Mother   . Cancer Maternal Aunt        bladder  . Diabetes Neg Hx     Social History: Social History   Tobacco Use  . Smoking status: Current Every Day Smoker    Packs/day: 0.25    Years: 7.00    Pack years: 1.75    Types: Cigarettes  . Smokeless tobacco: Never Used  Substance Use Topics  . Alcohol use: No  . Drug use: No    Allergies: No Known Allergies  Meds:  Medications Prior to Admission  Medication Sig Dispense Refill Last Dose  . diphenhydrAMINE (BENADRYL) 25 mg capsule Take 1 capsule (25 mg total) by mouth every 8 (eight) hours as needed for allergies. 30 capsule 0 unk  . ondansetron (ZOFRAN ODT) 4 MG disintegrating tablet Take 1 tablet (4 mg total)  by mouth every 8 (eight) hours as needed for nausea or vomiting. 20 tablet 0 unk  . Prenatal Multivit-Min-Fe-FA (PRENATAL VITAMINS PO) Take by mouth.   Taking    I have reviewed patient's Past Medical Hx, Surgical Hx, Family Hx, Social Hx, medications and allergies.   ROS:  Review of Systems  Constitutional: Negative for fever.  Gastrointestinal: Positive for abdominal pain. Negative for constipation, diarrhea, nausea and vomiting.  Genitourinary: Positive for vaginal discharge. Negative for dysuria.  Musculoskeletal: Negative for myalgias.   Other systems negative  Physical Exam   Patient Vitals for the past 24 hrs:  BP Temp Temp src Pulse Resp SpO2 Height Weight  05/12/17 0237 124/73 98.8 F (37.1 C) Oral 77 18 99 % 5\' 3"  (1.6 m) 159 lb (72.1  kg)   Constitutional: Well-developed, well-nourished female in no acute distress.  Cardiovascular: normal rate and rhythm Respiratory: normal effort, clear to auscultation bilaterally GI: Abd soft, non-tender, gravid appropriate for gestational age.   No rebound or guarding. MS: Extremities nontender, no edema, normal ROM Neurologic: Alert and oriented x 4.  GU: Neg CVAT.  PELVIC EXAM: Cervix pink, visually closed, without lesion, scant white creamy discharge, vaginal walls and external genitalia normal Cervix long and closed   FHT:   152  Labs: Results for orders placed or performed during the hospital encounter of 05/12/17 (from the past 24 hour(s))  Urinalysis, Routine w reflex microscopic     Status: Abnormal   Collection Time: 05/12/17  2:40 AM  Result Value Ref Range   Color, Urine YELLOW YELLOW   APPearance CLEAR CLEAR   Specific Gravity, Urine 1.026 1.005 - 1.030   pH 6.0 5.0 - 8.0   Glucose, UA NEGATIVE NEGATIVE mg/dL   Hgb urine dipstick NEGATIVE NEGATIVE   Bilirubin Urine NEGATIVE NEGATIVE   Ketones, ur 5 (A) NEGATIVE mg/dL   Protein, ur NEGATIVE NEGATIVE mg/dL   Nitrite NEGATIVE NEGATIVE   Leukocytes, UA NEGATIVE NEGATIVE    B/Positive/-- (01/28 1133)  Imaging:    MAU Course/MDM: I have ordered labs and reviewed results. UA normal .  Treatments in MAU included Speculum exam which was negative for ruptured membranes.    Assessment: Single IUP at 5921w0d Round ligament pain Vaginal discharge in pregnancy  Plan: Discharge home Preterm Labor precautions and fetal kick counts Follow up in Office for prenatal visits and recheck  Encouraged to return here or to other Urgent Care/ED if she develops worsening of symptoms, increase in pain, fever, or other concerning symptoms.  Pt stable at time of discharge.  Wynelle BourgeoisMarie Lesa Vandall CNM, MSN Certified Nurse-Midwife 05/12/2017 2:58 AM

## 2017-05-12 NOTE — MAU Note (Signed)
Pt here with c/o cramping in lower abdomen. Lifted a box at work tonight and felt a sharp pain. Denies bleeding. Possibly leaking fluid, "underwear felt wet"

## 2017-05-19 ENCOUNTER — Other Ambulatory Visit: Payer: Self-pay | Admitting: Family Medicine

## 2017-05-19 ENCOUNTER — Ambulatory Visit (HOSPITAL_COMMUNITY)
Admission: RE | Admit: 2017-05-19 | Discharge: 2017-05-19 | Disposition: A | Discharge status: Home / Self Care | Payer: Managed Care, Other (non HMO) | Source: Ambulatory Visit | Attending: Family Medicine | Admitting: Family Medicine

## 2017-05-19 DIAGNOSIS — Z3490 Encounter for supervision of normal pregnancy, unspecified, unspecified trimester: Secondary | ICD-10-CM

## 2017-05-19 DIAGNOSIS — Z362 Encounter for other antenatal screening follow-up: Secondary | ICD-10-CM

## 2017-05-19 DIAGNOSIS — Z3A23 23 weeks gestation of pregnancy: Secondary | ICD-10-CM | POA: Insufficient documentation

## 2017-05-19 NOTE — Addendum Note (Signed)
Encounter addended by: Levonne Hubert, RDMS, RVT on: 05/19/2017 4:58 PM  Actions taken: Imaging Exam ended

## 2017-06-02 ENCOUNTER — Ambulatory Visit (INDEPENDENT_AMBULATORY_CARE_PROVIDER_SITE_OTHER): Payer: Medicaid Other | Admitting: Family Medicine

## 2017-06-02 VITALS — BP 124/71 | HR 83 | Wt 163.0 lb

## 2017-06-02 DIAGNOSIS — Z3480 Encounter for supervision of other normal pregnancy, unspecified trimester: Secondary | ICD-10-CM

## 2017-06-02 DIAGNOSIS — R42 Dizziness and giddiness: Secondary | ICD-10-CM

## 2017-06-02 DIAGNOSIS — Z3482 Encounter for supervision of other normal pregnancy, second trimester: Secondary | ICD-10-CM

## 2017-06-02 NOTE — Progress Notes (Signed)
   PRENATAL VISIT NOTE  Subjective:  Deztinee Lohmeyer is a 22 y.o. G2P0010 at [redacted]w[redacted]d being seen today for ongoing prenatal care.  She is currently monitored for the following issues for this low-risk pregnancy and has Hearing loss; ECZEMA; Depression; Migraine; Supervision of other normal pregnancy, antepartum; Asthma affecting pregnancy, antepartum; and Tobacco smoking affecting pregnancy, antepartum on their problem list.  Patient reports vertigo. Happens frequently, lasting for a minute or so. Not sure how it relates to when she last ate or drank fluids. Knows that she doesn't drink a lot of water..  Contractions: Not present. Vag. Bleeding: None.  Movement: Present. Denies leaking of fluid.   The following portions of the patient's history were reviewed and updated as appropriate: allergies, current medications, past family history, past medical history, past social history, past surgical history and problem list. Problem list updated.  Objective:   Vitals:   06/02/17 1000  BP: 124/71  Pulse: 83  Weight: 163 lb (73.9 kg)    Fetal Status: Fetal Heart Rate (bpm): 145   Movement: Present     General:  Alert, oriented and cooperative. Patient is in no acute distress.  Skin: Skin is warm and dry. No rash noted.   Cardiovascular: Normal heart rate noted  Respiratory: Normal respiratory effort, no problems with respiration noted  Abdomen: Soft, gravid, appropriate for gestational age.  Pain/Pressure: Present     Pelvic: Cervical exam deferred        Extremities: Normal range of motion.  Edema: None  Mental Status: Normal mood and affect. Normal behavior. Normal judgment and thought content.   Assessment and Plan:  Pregnancy: G2P0010 at [redacted]w[redacted]d  1. Supervision of other normal pregnancy, antepartum FHT and FH normal  2. Vertigo Recommended 1.5-2 L of water a day. Also frequent meals/snacks to avoid low blood sugar. If persists despite this, then ENT to r/o BPPV  Preterm labor symptoms  and general obstetric precautions including but not limited to vaginal bleeding, contractions, leaking of fluid and fetal movement were reviewed in detail with the patient. Please refer to After Visit Summary for other counseling recommendations.  Return in about 3 weeks (around 06/23/2017) for OB f/u, 2 hr GTT.  No future appointments.  Levie Heritage, DO

## 2017-06-23 ENCOUNTER — Ambulatory Visit (INDEPENDENT_AMBULATORY_CARE_PROVIDER_SITE_OTHER): Payer: Medicaid Other | Admitting: Obstetrics and Gynecology

## 2017-06-23 ENCOUNTER — Ambulatory Visit: Payer: Medicaid Other

## 2017-06-23 ENCOUNTER — Encounter: Payer: Self-pay | Admitting: Obstetrics and Gynecology

## 2017-06-23 VITALS — BP 123/70 | HR 80 | Wt 175.0 lb

## 2017-06-23 DIAGNOSIS — Z3490 Encounter for supervision of normal pregnancy, unspecified, unspecified trimester: Secondary | ICD-10-CM

## 2017-06-23 DIAGNOSIS — Z3480 Encounter for supervision of other normal pregnancy, unspecified trimester: Secondary | ICD-10-CM

## 2017-06-23 MED ORDER — COMFORT FIT MATERNITY SUPP MED MISC: 0 refills | Status: DC

## 2017-06-23 NOTE — Progress Notes (Signed)
   PRENATAL VISIT NOTE  Subjective:  Cassidy Meyer is a 22 y.o. G2P0010 at 8635w0d being seen today for ongoing prenatal care.  She is currently monitored for the following issues for this low-risk pregnancy and has Hearing loss; ECZEMA; Depression; Migraine; Supervision of other normal pregnancy, antepartum; Asthma affecting pregnancy, antepartum; and Tobacco smoking affecting pregnancy, antepartum on their problem list.  Patient reports pelvic pressure and lower back pain.  Contractions: Not present. Vag. Bleeding: None.  Movement: Present. Denies leaking of fluid.   The following portions of the patient's history were reviewed and updated as appropriate: allergies, current medications, past family history, past medical history, past social history, past surgical history and problem list. Problem list updated.  Objective:   Vitals:   06/23/17 0859  BP: 123/70  Pulse: 80  Weight: 175 lb (79.4 kg)    Fetal Status:     Movement: Present     General:  Alert, oriented and cooperative. Patient is in no acute distress.  Skin: Skin is warm and dry. No rash noted.   Cardiovascular: Normal heart rate noted  Respiratory: Normal respiratory effort, no problems with respiration noted  Abdomen: Soft, gravid, appropriate for gestational age.  Pain/Pressure: Present     Pelvic: Cervical exam deferred        Extremities: Normal range of motion.  Edema: None  Mental Status: Normal mood and affect. Normal behavior. Normal judgment and thought content.   Assessment and Plan:  Pregnancy: G2P0010 at 3935w0d  1. Prenatal care, antepartum Patient is doing well without complaints Third trimester labs today Patient has recently moved to [place] and desires to change ob office. Information for [facility] provided Rx maternity support belt provided to ease lower back pain and pelvic pressure - Glucose Tolerance, 2 Hours w/1 Hour - RPR - HIV antibody (with reflex) - CBC  2. Supervision of other  normal pregnancy, antepartum   Preterm labor symptoms and general obstetric precautions including but not limited to vaginal bleeding, contractions, leaking of fluid and fetal movement were reviewed in detail with the patient. Please refer to After Visit Summary for other counseling recommendations.  No follow-ups on file.  Future Appointments  Date Time Provider Department Center  06/23/2017 11:00 AM Thompson Mckim, Gigi GinPeggy, MD [facility] None    Catalina AntiguaPeggy Zianne Schubring, MD

## 2017-06-24 LAB — GLUCOSE TOLERANCE, 2 HOURS W/ 1HR
Glucose, 1 hour: 92 mg/dL (ref 65–179)
Glucose, 2 hour: 94 mg/dL (ref 65–152)
Glucose, Fasting: 69 mg/dL (ref 65–91)

## 2017-06-24 LAB — RPR: RPR Ser Ql: NONREACTIVE

## 2017-06-24 LAB — CBC
HEMATOCRIT: 36.9 % (ref 34.0–46.6)
Hemoglobin: 11.7 g/dL (ref 11.1–15.9)
MCH: 28.1 pg (ref 26.6–33.0)
MCHC: 31.7 g/dL (ref 31.5–35.7)
MCV: 89 fL (ref 79–97)
Platelets: 159 10*3/uL (ref 150–450)
RBC: 4.17 x10E6/uL (ref 3.77–5.28)
RDW: 14.7 % (ref 12.3–15.4)
WBC: 7.9 10*3/uL (ref 3.4–10.8)

## 2017-06-24 LAB — HIV ANTIBODY (ROUTINE TESTING W REFLEX): HIV Screen 4th Generation wRfx: NONREACTIVE

## 2017-07-07 ENCOUNTER — Encounter: Payer: Self-pay | Admitting: Advanced Practice Midwife

## 2017-07-07 ENCOUNTER — Ambulatory Visit (INDEPENDENT_AMBULATORY_CARE_PROVIDER_SITE_OTHER): Payer: Managed Care, Other (non HMO) | Admitting: Advanced Practice Midwife

## 2017-07-07 ENCOUNTER — Other Ambulatory Visit: Payer: Self-pay

## 2017-07-07 VITALS — BP 137/81 | Wt 181.0 lb

## 2017-07-07 DIAGNOSIS — O99330 Smoking (tobacco) complicating pregnancy, unspecified trimester: Secondary | ICD-10-CM

## 2017-07-07 DIAGNOSIS — Z3483 Encounter for supervision of other normal pregnancy, third trimester: Secondary | ICD-10-CM | POA: Diagnosis not present

## 2017-07-07 DIAGNOSIS — Z1389 Encounter for screening for other disorder: Secondary | ICD-10-CM

## 2017-07-07 DIAGNOSIS — J45909 Unspecified asthma, uncomplicated: Secondary | ICD-10-CM

## 2017-07-07 DIAGNOSIS — Z331 Pregnant state, incidental: Secondary | ICD-10-CM | POA: Diagnosis not present

## 2017-07-07 DIAGNOSIS — Z3A30 30 weeks gestation of pregnancy: Secondary | ICD-10-CM | POA: Diagnosis not present

## 2017-07-07 DIAGNOSIS — Z3480 Encounter for supervision of other normal pregnancy, unspecified trimester: Secondary | ICD-10-CM

## 2017-07-07 DIAGNOSIS — O99519 Diseases of the respiratory system complicating pregnancy, unspecified trimester: Secondary | ICD-10-CM

## 2017-07-07 LAB — POCT URINALYSIS DIPSTICK
Blood, UA: NEGATIVE
Glucose, UA: NEGATIVE
KETONES UA: NEGATIVE
Leukocytes, UA: NEGATIVE
Nitrite, UA: NEGATIVE
PROTEIN UA: NEGATIVE

## 2017-07-07 NOTE — Progress Notes (Signed)
137 LOW-RISK PREGNANCY VISIT Patient name: Cassidy Meyer MRN [id number]  Date of birth: [birth year] Chief Complaint:   Routine Prenatal Visit Moved to [facility] so transfer  Pg uneventful and up to date care History of Present Illness:   Cassidy Meyer is a 22 y.o. G2P0010 female at [redacted]w[redacted]d with an Estimated Date of Delivery: 09/15/17 being seen today for ongoing management of a low-risk pregnancy.  Today she reports no complaints. Contractions: Irregular. Vag. Bleeding: None.  Movement: Present. denies leaking of fluid. Review of Systems:   Pertinent items are noted in HPI Denies abnormal vaginal discharge w/ itching/odor/irritation, headaches, visual changes, shortness of breath, chest pain, abdominal pain, severe nausea/vomiting, or problems with urination or bowel movements unless otherwise stated above.  Pertinent History Reviewed:  Medical & Surgical Hx:   Past Medical History:  Diagnosis Date  . Asthma   . Bronchitis    Past Surgical History:  Procedure Laterality Date  . APPENDECTOMY    . HERNIA REPAIR     Family History  Problem Relation Age of Onset  . Hypertension Mother   . Cancer Maternal Aunt        bladder  . Dementia Maternal Grandmother   . Diabetes Neg Hx     Current Outpatient Medications:  .  Elastic Bandages & Supports (COMFORT FIT MATERNITY SUPP MED) MISC, Wear daily when ambulating, Disp: 1 each, Rfl: 0 .  Prenatal Multivit-Min-Fe-FA (PRENATAL VITAMINS PO), Take by mouth., Disp: , Rfl:  .  diphenhydrAMINE (BENADRYL) 25 mg capsule, Take 1 capsule (25 mg total) by mouth every 8 (eight) hours as needed for allergies. (Patient not taking: Reported on 07/07/2017), Disp: 30 capsule, Rfl: 0 .  ondansetron (ZOFRAN ODT) 4 MG disintegrating tablet, Take 1 tablet (4 mg total) by mouth every 8 (eight) hours as needed for nausea or vomiting. (Patient not taking: Reported on 06/02/2017), Disp: 20 tablet, Rfl: 0 Social History: Reviewed -  reports that she has quit smoking.  Her smoking use included cigarettes. She has a 1.75 pack-year smoking history. She has never used smokeless tobacco.  Physical Assessment:  There were no vitals filed for this visit.There is no height or weight on file to calculate BMI.        Physical Examination:   General appearance: Well appearing, and in no distress  Mental status: Alert, oriented to person, place, and time  Skin: Warm & dry  Cardiovascular: Normal heart rate noted  Respiratory: Normal respiratory effort, no distress  Abdomen: Soft, gravid, nontender  Pelvic: Cervical exam deferred         Extremities: Edema: Trace  Fetal Status:     Movement: Present    Results for orders placed or performed in visit on 07/07/17 (from the past 24 hour(s))  POCT urinalysis dipstick   Collection Time: 07/07/17 10:19 AM  Result Value Ref Range   Color, UA     Clarity, UA     Glucose, UA Negative Negative   Bilirubin, UA     Ketones, UA neg    Spec Grav, UA  1.010 - 1.025   Blood, UA neg    pH, UA  5.0 - 8.0   Protein, UA Negative Negative   Urobilinogen, UA  0.2 or 1.0 E.U./dL   Nitrite, UA neg    Leukocytes, UA Negative Negative   Appearance     Odor      Assessment & Plan:  1) Low-risk pregnancy G2P0010 at [redacted]w[redacted]d with an Estimated Date of Delivery: 09/15/17  2) ,    Labs/procedures/US today:   Plan:  Continue routine obstetrical care    Follow-up: Return in about 2 weeks (around 07/21/2017) for LROB.  Orders Placed This Encounter  Procedures  . POCT urinalysis dipstick   Jacklyn ShellFrances Cresenzo-Dishmon CNM 07/07/2017 10:37 AM

## 2017-07-12 ENCOUNTER — Other Ambulatory Visit: Payer: Self-pay

## 2017-07-12 ENCOUNTER — Inpatient Hospital Stay (HOSPITAL_COMMUNITY)
Admission: AD | Admit: 2017-07-12 | Discharge: 2017-07-12 | Disposition: A | Discharge status: Home / Self Care | Payer: Managed Care, Other (non HMO) | Source: Ambulatory Visit | Attending: Obstetrics and Gynecology | Admitting: Obstetrics and Gynecology

## 2017-07-12 ENCOUNTER — Encounter (HOSPITAL_COMMUNITY): Payer: Self-pay | Admitting: *Deleted

## 2017-07-12 DIAGNOSIS — Z79899 Other long term (current) drug therapy: Secondary | ICD-10-CM | POA: Insufficient documentation

## 2017-07-12 DIAGNOSIS — Z87891 Personal history of nicotine dependence: Secondary | ICD-10-CM | POA: Diagnosis not present

## 2017-07-12 DIAGNOSIS — O4700 False labor before 37 completed weeks of gestation, unspecified trimester: Secondary | ICD-10-CM

## 2017-07-12 DIAGNOSIS — O479 False labor, unspecified: Secondary | ICD-10-CM

## 2017-07-12 DIAGNOSIS — Z8249 Family history of ischemic heart disease and other diseases of the circulatory system: Secondary | ICD-10-CM | POA: Insufficient documentation

## 2017-07-12 DIAGNOSIS — O26893 Other specified pregnancy related conditions, third trimester: Secondary | ICD-10-CM | POA: Diagnosis present

## 2017-07-12 DIAGNOSIS — O99513 Diseases of the respiratory system complicating pregnancy, third trimester: Secondary | ICD-10-CM | POA: Diagnosis not present

## 2017-07-12 DIAGNOSIS — Z3A30 30 weeks gestation of pregnancy: Secondary | ICD-10-CM

## 2017-07-12 DIAGNOSIS — Z3689 Encounter for other specified antenatal screening: Secondary | ICD-10-CM

## 2017-07-12 DIAGNOSIS — J45909 Unspecified asthma, uncomplicated: Secondary | ICD-10-CM | POA: Insufficient documentation

## 2017-07-12 DIAGNOSIS — O368130 Decreased fetal movements, third trimester, not applicable or unspecified: Secondary | ICD-10-CM | POA: Diagnosis not present

## 2017-07-12 DIAGNOSIS — O368190 Decreased fetal movements, unspecified trimester, not applicable or unspecified: Secondary | ICD-10-CM

## 2017-07-12 DIAGNOSIS — Z3480 Encounter for supervision of other normal pregnancy, unspecified trimester: Secondary | ICD-10-CM

## 2017-07-12 DIAGNOSIS — M549 Dorsalgia, unspecified: Secondary | ICD-10-CM | POA: Diagnosis present

## 2017-07-12 DIAGNOSIS — O99519 Diseases of the respiratory system complicating pregnancy, unspecified trimester: Secondary | ICD-10-CM

## 2017-07-12 DIAGNOSIS — O99330 Smoking (tobacco) complicating pregnancy, unspecified trimester: Secondary | ICD-10-CM

## 2017-07-12 LAB — URINALYSIS, ROUTINE W REFLEX MICROSCOPIC
BILIRUBIN URINE: NEGATIVE
Glucose, UA: NEGATIVE mg/dL
Hgb urine dipstick: NEGATIVE
Ketones, ur: NEGATIVE mg/dL
Leukocytes, UA: NEGATIVE
Nitrite: NEGATIVE
Protein, ur: NEGATIVE mg/dL
SPECIFIC GRAVITY, URINE: 1.018 (ref 1.005–1.030)
pH: 6 (ref 5.0–8.0)

## 2017-07-12 LAB — WET PREP, GENITAL
Clue Cells Wet Prep HPF POC: NONE SEEN
SPERM: NONE SEEN
TRICH WET PREP: NONE SEEN
YEAST WET PREP: NONE SEEN

## 2017-07-12 MED ORDER — NIFEDIPINE 10 MG PO CAPS
10.0000 mg | ORAL_CAPSULE | ORAL | Status: AC
  Administered 2017-07-12 (×3): 10 mg via ORAL
  Filled 2017-07-12 (×3): qty 1

## 2017-07-12 MED ORDER — ACETAMINOPHEN 500 MG PO TABS
1000.0000 mg | ORAL_TABLET | Freq: Four times a day (QID) | ORAL | Status: DC | PRN
  Administered 2017-07-12: 1000 mg via ORAL
  Filled 2017-07-12: qty 2

## 2017-07-12 NOTE — MAU Note (Signed)
Full pitcher of water and heat packs given.

## 2017-07-12 NOTE — MAU Note (Signed)
Pt. States she was at work 30-45 min ago and was hit by a metal pole on the lower back side at work. States 6/10 for back pain. Rates 8/10 for low abd/pelvic pain. States the pain is sharp and intermittent. Denies vaginal bleeding. States discharge is white and has abnormal odor.

## 2017-07-12 NOTE — MAU Note (Signed)
PT SAYS  SHE WAS AT WORK - [employer] - AT 0430- SHE WAS ACCIDENTALLY HIT BY MEDAL POLE  ON RIGHT BACK THAT FELL  FROM A TRUCK    AND  NO  FETAL MOVEMENT  SINCE Saturday.  NO MOVEMENT  NOW IN TRIAGE-  BUT [facility]- 152

## 2017-07-12 NOTE — Discharge Instructions (Signed)
Braxton Hicks Contractions °Contractions of the uterus can occur throughout pregnancy, but they are not always a sign that you are in labor. You may have practice contractions called Braxton Hicks contractions. These false labor contractions are sometimes confused with true labor. °What are Braxton Hicks contractions? °Braxton Hicks contractions are tightening movements that occur in the muscles of the uterus before labor. Unlike true labor contractions, these contractions do not result in opening (dilation) and thinning of the cervix. Toward the end of pregnancy (32-34 weeks), Braxton Hicks contractions can happen more often and may become stronger. These contractions are sometimes difficult to tell apart from true labor because they can be very uncomfortable. You should not feel embarrassed if you go to the hospital with false labor. °Sometimes, the only way to tell if you are in true labor is for your health care provider to look for changes in the cervix. The health care provider will do a physical exam and may monitor your contractions. If you are not in true labor, the exam should show that your cervix is not dilating and your water has not broken. °If there are other health problems associated with your pregnancy, it is completely safe for you to be sent home with false labor. You may continue to have Braxton Hicks contractions until you go into true labor. °How to tell the difference between true labor and false labor °True labor °· Contractions last 30-70 seconds. °· Contractions become very regular. °· Discomfort is usually felt in the top of the uterus, and it spreads to the lower abdomen and low back. °· Contractions do not go away with walking. °· Contractions usually become more intense and increase in frequency. °· The cervix dilates and gets thinner. °False labor °· Contractions are usually shorter and not as strong as true labor contractions. °· Contractions are usually irregular. °· Contractions  are often felt in the front of the lower abdomen and in the groin. °· Contractions may go away when you walk around or change positions while lying down. °· Contractions get weaker and are shorter-lasting as time goes on. °· The cervix usually does not dilate or become thin. °Follow these instructions at home: °· Take over-the-counter and prescription medicines only as told by your health care provider. °· Keep up with your usual exercises and follow other instructions from your health care provider. °· Eat and drink lightly if you think you are going into labor. °· If Braxton Hicks contractions are making you uncomfortable: °? Change your position from lying down or resting to walking, or change from walking to resting. °? Sit and rest in a tub of warm water. °? Drink enough fluid to keep your urine pale yellow. Dehydration may cause these contractions. °? Do slow and deep breathing several times an hour. °· Keep all follow-up prenatal visits as told by your health care provider. This is important. °Contact a health care provider if: °· You have a fever. °· You have continuous pain in your abdomen. °Get help right away if: °· Your contractions become stronger, more regular, and closer together. °· You have fluid leaking or gushing from your vagina. °· You pass blood-tinged mucus (bloody show). °· You have bleeding from your vagina. °· You have low back pain that you never had before. °· You feel your baby’s head pushing down and causing pelvic pressure. °· Your baby is not moving inside you as much as it used to. °Summary °· Contractions that occur before labor are called Braxton   Hicks contractions, false labor, or practice contractions. °· Braxton Hicks contractions are usually shorter, weaker, farther apart, and less regular than true labor contractions. True labor contractions usually become progressively stronger and regular and they become more frequent. °· Manage discomfort from Braxton Hicks contractions by  changing position, resting in a warm bath, drinking plenty of water, or practicing deep breathing. °This information is not intended to replace advice given to you by your health care provider. Make sure you discuss any questions you have with your health care provider. °Document Released: 05/20/2016 Document Revised: 05/20/2016 Document Reviewed: 05/20/2016 °Elsevier Interactive Patient Education © 2018 [employer] ° °

## 2017-07-12 NOTE — MAU Provider Note (Signed)
History     CSN: [id number]  Arrival date and time: 07/12/17 [id number]  Chief Complaint  Patient presents with  . Back Pain   G2P0010 @30 .5 wks here to get checked after a metal pole fell on her back. Incident happened about 1 hr ago while she was at work. Area feels sore. Also reports no FM x3 days. States this is normal for her, "I see the baby move more than I feel it". Denies VB, LOF, or ctx but reports intermitent sharp pains in her lower abdomen x1 week. Denies urinary sx. Having a thin white malodorous vaginal discharge, no itching. Admits to poor water intake today.   OB History    Gravida  2   Para  0   Term      Preterm      AB  1   Living        SAB      TAB  1   Ectopic      Multiple  0   Live Births              Past Medical History:  Diagnosis Date  . Asthma   . Bronchitis     Past Surgical History:  Procedure Laterality Date  . APPENDECTOMY    . HERNIA REPAIR      Family History  Problem Relation Age of Onset  . Hypertension Mother   . Cancer Maternal Aunt        bladder  . Dementia Maternal Grandmother   . Diabetes Neg Hx     Social History   Tobacco Use  . Smoking status: Former Smoker    Packs/day: 0.25    Years: 7.00    Pack years: 1.75    Types: Cigarettes  . Smokeless tobacco: Never Used  Substance Use Topics  . Alcohol use: No  . Drug use: No    Allergies: No Known Allergies  Medications Prior to Admission  Medication Sig Dispense Refill Last Dose  . Prenatal Multivit-Min-Fe-FA (PRENATAL VITAMINS PO) Take by mouth.   07/11/2017 at Unknown time  . diphenhydrAMINE (BENADRYL) 25 mg capsule Take 1 capsule (25 mg total) by mouth every 8 (eight) hours as needed for allergies. (Patient not taking: Reported on 07/07/2017) 30 capsule 0 More than a month at Unknown time  . Elastic Bandages & Supports (COMFORT FIT MATERNITY SUPP MED) MISC Wear daily when ambulating 1 each 0 More than a month at Unknown time  . ondansetron (ZOFRAN  ODT) 4 MG disintegrating tablet Take 1 tablet (4 mg total) by mouth every 8 (eight) hours as needed for nausea or vomiting. (Patient not taking: Reported on 06/02/2017) 20 tablet 0 More than a month at Unknown time    Review of Systems  Gastrointestinal: Positive for abdominal pain.  Genitourinary: Positive for vaginal discharge. Negative for dysuria, frequency, urgency and vaginal bleeding.  Skin: Color change: red bruise to right low back.   Physical Exam   Blood pressure (!) 116/53, pulse 94, temperature 98.5 F (36.9 C), temperature source Oral, resp. rate 20, height 5\' 4"  (1.626 m), weight 185 lb 8 oz (84.1 kg), last menstrual period 12/15/2016.  Physical Exam  Constitutional: She is oriented to person, place, and time. She appears well-developed and well-nourished. No distress.  HENT:  Head: Normocephalic and atraumatic.  Neck: Normal range of motion.  Cardiovascular: Normal rate.  Respiratory: Effort normal. No respiratory distress.  GI: Soft. She exhibits no distension. There is no tenderness.  gravid  Genitourinary:  Genitourinary Comments: SVE: closed/60  Musculoskeletal: Normal range of motion.  Neurological: She is alert and oriented to person, place, and time.  Skin: Skin is warm and dry.  Small red bruise to right low back  Psychiatric: She has a normal mood and affect.  EFM: 145 bpm, mod variability, + accels, no decels Toco: 4-5  Results for orders placed or performed during the hospital encounter of 07/12/17 (from the past 24 hour(s))  Urinalysis, Routine w reflex microscopic     Status: Abnormal   Collection Time: 07/12/17  5:45 AM  Result Value Ref Range   Color, Urine YELLOW YELLOW   APPearance HAZY (A) CLEAR   Specific Gravity, Urine 1.018 1.005 - 1.030   pH 6.0 5.0 - 8.0   Glucose, UA NEGATIVE NEGATIVE mg/dL   Hgb urine dipstick NEGATIVE NEGATIVE   Bilirubin Urine NEGATIVE NEGATIVE   Ketones, ur NEGATIVE NEGATIVE mg/dL   Protein, ur NEGATIVE NEGATIVE  mg/dL   Nitrite NEGATIVE NEGATIVE   Leukocytes, UA NEGATIVE NEGATIVE  Wet prep, genital     Status: Abnormal   Collection Time: 07/12/17  6:04 AM  Result Value Ref Range   Yeast Wet Prep HPF POC NONE SEEN NONE SEEN   Trich, Wet Prep NONE SEEN NONE SEEN   Clue Cells Wet Prep HPF POC NONE SEEN NONE SEEN   WBC, Wet Prep HPF POC FEW (A) NONE SEEN   Sperm NONE SEEN    MAU Course  Procedures Tylenol Procardia x3  MDM Labs ordered and reviewed. Felt FM once EFM applied. FM audible. Ctx q4-5 min noted, Procardia ordered and po hydration. FFN collected but specimen unable to be run d/t cap coming off while in lab. Feeling better after Procardia, pain improved. Ctx rare now. Cervix unchanged. Stable for discharge home.   Assessment and Plan  [redacted] weeks gestation NST reactive  Preterm contractions Decreased fetal movements Discharge home Follow up at [facility] this week PTL precautions  Allergies as of 07/12/2017   No Known Allergies     Medication List    STOP taking these medications   diphenhydrAMINE 25 mg capsule Commonly known as:  BENADRYL   ondansetron 4 MG disintegrating tablet Commonly known as:  ZOFRAN ODT     TAKE these medications   COMFORT FIT MATERNITY SUPP MED Misc Wear daily when ambulating   PRENATAL VITAMINS PO Take by mouth.      Donette Larry, CNM 07/12/2017, 7:14 AM

## 2017-07-12 NOTE — MAU Note (Addendum)
Received call from Lab stating not enough fluid left in Fetal Fibronectin tube to collect. Lab had to cancel order. Provider notified.

## 2017-07-13 LAB — GC/CHLAMYDIA PROBE AMP (~~LOC~~) NOT AT ARMC
Chlamydia: NEGATIVE
Neisseria Gonorrhea: NEGATIVE

## 2017-07-14 ENCOUNTER — Encounter: Payer: Self-pay | Admitting: Women's Health

## 2017-07-14 ENCOUNTER — Ambulatory Visit (INDEPENDENT_AMBULATORY_CARE_PROVIDER_SITE_OTHER): Payer: Managed Care, Other (non HMO) | Admitting: Women's Health

## 2017-07-14 VITALS — BP 115/70 | HR 89 | Wt 184.0 lb

## 2017-07-14 DIAGNOSIS — Z331 Pregnant state, incidental: Secondary | ICD-10-CM | POA: Diagnosis not present

## 2017-07-14 DIAGNOSIS — Z3A31 31 weeks gestation of pregnancy: Secondary | ICD-10-CM | POA: Diagnosis not present

## 2017-07-14 DIAGNOSIS — Z3483 Encounter for supervision of other normal pregnancy, third trimester: Secondary | ICD-10-CM | POA: Diagnosis not present

## 2017-07-14 DIAGNOSIS — Z1389 Encounter for screening for other disorder: Secondary | ICD-10-CM | POA: Diagnosis not present

## 2017-07-14 LAB — POCT URINALYSIS DIPSTICK
Glucose, UA: NEGATIVE
Ketones, UA: NEGATIVE
LEUKOCYTES UA: NEGATIVE
NITRITE UA: NEGATIVE
PROTEIN UA: POSITIVE — AB
RBC UA: NEGATIVE

## 2017-07-14 NOTE — Progress Notes (Signed)
   LOW-RISK PREGNANCY VISIT Patient name: Cassidy GardnerJessica Meyer MRN [id number]  Date of birth: 03/04/1995 Chief Complaint:   No chief complaint on file.  History of Present Illness:   Cassidy Meyer is a 22 y.o. G2P0010 female at 4048w0d with an Estimated Date of Delivery: 09/15/17 being seen today for ongoing management of a low-risk pregnancy.  Today she reports went to MAU couple of days ago, was contracting and was told cx had thinned some (documented as closed/60), procardia x 3. Hasn't felt any uc's since then. Contractions: Irregular.  .  Movement: Present. denies leaking of fluid. Review of Systems:   Pertinent items are noted in HPI Denies abnormal vaginal discharge w/ itching/odor/irritation, headaches, visual changes, shortness of breath, chest pain, abdominal pain, severe nausea/vomiting, or problems with urination or bowel movements unless otherwise stated above. Pertinent History Reviewed:  Reviewed past medical,surgical, social, obstetrical and family history.  Reviewed problem list, medications and allergies. Physical Assessment:   Vitals:   07/14/17 0916  BP: 115/70  Pulse: 89  Weight: 184 lb (83.5 kg)  Body mass index is 31.58 kg/m.        Physical Examination:   General appearance: Well appearing, and in no distress  Mental status: Alert, oriented to person, place, and time  Skin: Warm & dry  Cardiovascular: Normal heart rate noted  Respiratory: Normal respiratory effort, no distress  Abdomen: Soft, gravid, nontender  Pelvic: Cervical exam deferred         Extremities: Edema: None  Fetal Status: Fetal Heart Rate (bpm): 142 Fundal Height: 32 cm Movement: Present    Results for orders placed or performed in visit on 07/14/17 (from the past 24 hour(s))  POCT urinalysis dipstick   Collection Time: 07/14/17  9:27 AM  Result Value Ref Range   Color, UA     Clarity, UA     Glucose, UA Negative Negative   Bilirubin, UA     Ketones, UA neg    Spec Grav, UA  1.010 - 1.025    Blood, UA neg    pH, UA  5.0 - 8.0   Protein, UA Positive (A) Negative   Urobilinogen, UA  0.2 or 1.0 E.U./dL   Nitrite, UA neg    Leukocytes, UA Negative Negative   Appearance     Odor      Assessment & Plan:  1) Low-risk pregnancy G2P0010 at 6948w0d with an Estimated Date of Delivery: 09/15/17    Meds: No orders of the defined types were placed in this encounter.  Labs/procedures today: declined tdap  Plan:  Continue routine obstetrical care   Reviewed: Preterm labor symptoms and general obstetric precautions including but not limited to vaginal bleeding, contractions, leaking of fluid and fetal movement were reviewed in detail with the patient.  Recommended Tdap at HD/PCP per CDC guidelines. All questions were answered  Follow-up: Return in about 2 weeks (around 07/28/2017) for LROB.  Orders Placed This Encounter  Procedures  . POCT urinalysis dipstick   Cheral MarkerKimberly R Tomika Eckles CNM, [facility]phWHNP-BC 07/14/2017 9:49 AM

## 2017-07-14 NOTE — Patient Instructions (Signed)
Cassidy GardnerJessica Meyer, I greatly value your feedback.  If you receive a survey following your visit with us today, we appreciate you taking the time to fill it out.  Thanks, Joellyn HaffKim Daveigh Batty, CNM, WHNP-BC   Call the office [phone]) or go to [facility] if:  You begin to have strong, frequent contractions  Your water breaks.  Sometimes it is a big gush of fluid, sometimes it is just a trickle that keeps getting your panties wet or running down your legs  You have vaginal bleeding.  It is normal to have a small amount of spotting if your cervix was checked.   You don't feel your baby moving like normal.  If you don't, get you something to eat and drink and lay down and focus on feeling your baby move.  You should feel at least 10 movements in 2 hours.  If you don't, you should call the office or go to [facility].    Tdap Vaccine  It is recommended that you get the Tdap vaccine during the third trimester of EACH pregnancy to help protect your baby from getting pertussis (whooping cough)  27-36 weeks is the BEST time to do this so that you can pass the protection on to your baby. During pregnancy is better than after pregnancy, but if you are unable to get it during pregnancy it will be offered at the hospital.   You can get this vaccine at the health department or your family doctor  Everyone who will be around your baby should also be up-to-date on their vaccines. Adults (who are not pregnant) only need 1 dose of Tdap during adulthood.   Third Trimester of Pregnancy The third trimester is from week 29 through week 42, months 7 through 9. The third trimester is a time when the fetus is growing rapidly. At the end of the ninth month, the fetus is about 20 inches in length and weighs 6-10 pounds.  BODY CHANGES Your body goes through many changes during pregnancy. The changes vary from woman to woman.   Your weight will continue to increase. You can expect to gain 25-35 pounds (11-16 kg) by  the end of the pregnancy.  You may begin to get stretch marks on your hips, abdomen, and breasts.  You may urinate more often because the fetus is moving lower into your pelvis and pressing on your bladder.  You may develop or continue to have heartburn as a result of your pregnancy.  You may develop constipation because certain hormones are causing the muscles that push waste through your intestines to slow down.  You may develop hemorrhoids or swollen, bulging veins (varicose veins).  You may have pelvic pain because of the weight gain and pregnancy hormones relaxing your joints between the bones in your pelvis. Backaches may result from overexertion of the muscles supporting your posture.  You may have changes in your hair. These can include thickening of your hair, rapid growth, and changes in texture. Some women also have hair loss during or after pregnancy, or hair that feels dry or thin. Your hair will most likely return to normal after your baby is born.  Your breasts will continue to grow and be tender. A yellow discharge may leak from your breasts called colostrum.  Your belly button may stick out.  You may feel short of breath because of your expanding uterus.  You may notice the fetus "dropping," or moving lower in your abdomen.  You may have a bloody mucus  discharge. This usually occurs a few days to a week before labor begins.  Your cervix becomes thin and soft (effaced) near your due date. WHAT TO EXPECT AT YOUR PRENATAL EXAMS  You will have prenatal exams every 2 weeks until week 36. Then, you will have weekly prenatal exams. During a routine prenatal visit:  You will be weighed to make sure you and the fetus are growing normally.  Your blood pressure is taken.  Your abdomen will be measured to track your baby's growth.  The fetal heartbeat will be listened to.  Any test results from the previous visit will be discussed.  You may have a cervical check near your  due date to see if you have effaced. At around 36 weeks, your caregiver will check your cervix. At the same time, your caregiver will also perform a test on the secretions of the vaginal tissue. This test is to determine if a type of bacteria, Group B streptococcus, is present. Your caregiver will explain this further. Your caregiver may ask you:  What your birth plan is.  How you are feeling.  If you are feeling the baby move.  If you have had any abnormal symptoms, such as leaking fluid, bleeding, severe headaches, or abdominal cramping.  If you have any questions. Other tests or screenings that may be performed during your third trimester include:  Blood tests that check for low iron levels (anemia).  Fetal testing to check the health, activity level, and growth of the fetus. Testing is done if you have certain medical conditions or if there are problems during the pregnancy. FALSE LABOR You may feel small, irregular contractions that eventually go away. These are called Braxton Hicks contractions, or false labor. Contractions may last for hours, days, or even weeks before true labor sets in. If contractions come at regular intervals, intensify, or become painful, it is best to be seen by your caregiver.  SIGNS OF LABOR   Menstrual-like cramps.  Contractions that are 5 minutes apart or less.  Contractions that start on the top of the uterus and spread down to the lower abdomen and back.  A sense of increased pelvic pressure or back pain.  A watery or bloody mucus discharge that comes from the vagina. If you have any of these signs before the 37th week of pregnancy, call your caregiver right away. You need to go to the hospital to get checked immediately. HOME CARE INSTRUCTIONS   Avoid all smoking, herbs, alcohol, and unprescribed drugs. These chemicals affect the formation and growth of the baby.  Follow your caregiver's instructions regarding medicine use. There are medicines  that are either safe or unsafe to take during pregnancy.  Exercise only as directed by your caregiver. Experiencing uterine cramps is a good sign to stop exercising.  Continue to eat regular, healthy meals.  Wear a good support bra for breast tenderness.  Do not use hot tubs, steam rooms, or saunas.  Wear your seat belt at all times when driving.  Avoid raw meat, uncooked cheese, cat litter boxes, and soil used by cats. These carry germs that can cause birth defects in the baby.  Take your prenatal vitamins.  Try taking a stool softener (if your caregiver approves) if you develop constipation. Eat more high-fiber foods, such as fresh vegetables or fruit and whole grains. Drink plenty of fluids to keep your urine clear or pale yellow.  Take warm sitz baths to soothe any pain or discomfort caused by hemorrhoids. Use  hemorrhoid cream if your caregiver approves.  If you develop varicose veins, wear support hose. Elevate your feet for 15 minutes, 3-4 times a day. Limit salt in your diet.  Avoid heavy lifting, wear low heal shoes, and practice good posture.  Rest a lot with your legs elevated if you have leg cramps or low back pain.  Visit your dentist if you have not gone during your pregnancy. Use a soft toothbrush to brush your teeth and be gentle when you floss.  A sexual relationship may be continued unless your caregiver directs you otherwise.  Do not travel far distances unless it is absolutely necessary and only with the approval of your caregiver.  Take prenatal classes to understand, practice, and ask questions about the labor and delivery.  Make a trial run to the hospital.  Pack your hospital bag.  Prepare the baby's nursery.  Continue to go to all your prenatal visits as directed by your caregiver. SEEK MEDICAL CARE IF:  You are unsure if you are in labor or if your water has broken.  You have dizziness.  You have mild pelvic cramps, pelvic pressure, or nagging  pain in your abdominal area.  You have persistent nausea, vomiting, or diarrhea.  You have a bad smelling vaginal discharge.  You have pain with urination. SEEK IMMEDIATE MEDICAL CARE IF:   You have a fever.  You are leaking fluid from your vagina.  You have spotting or bleeding from your vagina.  You have severe abdominal cramping or pain.  You have rapid weight loss or gain.  You have shortness of breath with chest pain.  You notice sudden or extreme swelling of your face, hands, ankles, feet, or legs.  You have not felt your baby move in over an hour.  You have severe headaches that do not go away with medicine.  You have vision changes. Document Released: 12/29/2000 Document Revised: 01/09/2013 Document Reviewed: 03/07/2012 [facility] Patient Information 2015 [place], [place]. This information is not intended to replace advice given to you by your health care provider. Make sure you discuss any questions you have with your health care provider.

## 2017-07-22 ENCOUNTER — Encounter: Payer: Managed Care, Other (non HMO) | Admitting: Obstetrics & Gynecology

## 2017-07-28 ENCOUNTER — Encounter: Payer: Managed Care, Other (non HMO) | Admitting: Women's Health

## 2017-08-02 ENCOUNTER — Ambulatory Visit (INDEPENDENT_AMBULATORY_CARE_PROVIDER_SITE_OTHER): Payer: Managed Care, Other (non HMO) | Admitting: Medical

## 2017-08-02 ENCOUNTER — Encounter: Payer: Self-pay | Admitting: Medical

## 2017-08-02 VITALS — BP 123/82 | HR 109 | Wt 192.2 lb

## 2017-08-02 DIAGNOSIS — Z3480 Encounter for supervision of other normal pregnancy, unspecified trimester: Secondary | ICD-10-CM

## 2017-08-02 DIAGNOSIS — J45909 Unspecified asthma, uncomplicated: Secondary | ICD-10-CM

## 2017-08-02 DIAGNOSIS — O99513 Diseases of the respiratory system complicating pregnancy, third trimester: Secondary | ICD-10-CM

## 2017-08-02 DIAGNOSIS — Z3483 Encounter for supervision of other normal pregnancy, third trimester: Secondary | ICD-10-CM

## 2017-08-02 DIAGNOSIS — O99519 Diseases of the respiratory system complicating pregnancy, unspecified trimester: Secondary | ICD-10-CM

## 2017-08-02 NOTE — Progress Notes (Signed)
Patient reports good fetal movement with occasional sharp pains.

## 2017-08-02 NOTE — Patient Instructions (Signed)
Research childbirth classes and hospital preregistration at [email or web address]  Fetal Movement Counts Patient Name: ________________________________________________ Patient Due Date: ____________________ What is a fetal movement count? A fetal movement count is the number of times that you feel your baby move during a certain amount of time. This may also be called a fetal kick count. A fetal movement count is recommended for every pregnant woman. You may be asked to start counting fetal movements as early as week 28 of your pregnancy. Pay attention to when your baby is most active. You may notice your baby's sleep and wake cycles. You may also notice things that make your baby move more. You should do a fetal movement count:  When your baby is normally most active.  At the same time each day.  A good time to count movements is while you are resting, after having something to eat and drink. How do I count fetal movements? 1. Find a quiet, comfortable area. Sit, or lie down on your side. 2. Write down the date, the start time and stop time, and the number of movements that you felt between those two times. Take this information with you to your health care visits. 3. For 2 hours, count kicks, flutters, swishes, rolls, and jabs. You should feel at least 10 movements during 2 hours. 4. You may stop counting after you have felt 10 movements. 5. If you do not feel 10 movements in 2 hours, have something to eat and drink. Then, keep resting and counting for 1 hour. If you feel at least 4 movements during that hour, you may stop counting. Contact a health care provider if:  You feel fewer than 4 movements in 2 hours.  Your baby is not moving like he or she usually does. Date: ____________ Start time: ____________ Stop time: ____________ Movements: ____________ Date: ____________ Start time: ____________ Stop time: ____________ Movements: ____________ Date: ____________ Start time: ____________  Stop time: ____________ Movements: ____________ Date: ____________ Start time: ____________ Stop time: ____________ Movements: ____________ Date: ____________ Start time: ____________ Stop time: ____________ Movements: ____________ Date: ____________ Start time: ____________ Stop time: ____________ Movements: ____________ Date: ____________ Start time: ____________ Stop time: ____________ Movements: ____________ Date: ____________ Start time: ____________ Stop time: ____________ Movements: ____________ Date: ____________ Start time: ____________ Stop time: ____________ Movements: ____________ This information is not intended to replace advice given to you by your health care provider. Make sure you discuss any questions you have with your health care provider. Document Released: 02/03/2006 Document Revised: 09/03/2015 Document Reviewed: 02/13/2015 Else[employer] Interactive Patient Education  2018 [employer]  Braxton Hicks Contractions Contractions of the uterus can occur throughout pregnancy, but they are not always a sign that you are in labor. You may have practice contractions called Braxton Hicks contractions. These false labor contractions are sometimes confused with true labor. What are Braxton Hicks contractions? Braxton Hicks contractions are tightening movements that occur in the muscles of the uterus before labor. Unlike true labor contractions, these contractions do not result in opening (dilation) and thinning of the cervix. Toward the end of pregnancy (32-34 weeks), Braxton Hicks contractions can happen more often and may become stronger. These contractions are sometimes difficult to tell apart from true labor because they can be very uncomfortable. You should not feel embarrassed if you go to the hospital with false labor. Sometimes, the only way to tell if you are in true labor is for your health care provider to look for changes in the cervix. The health care provider will   do a physical  exam and may monitor your contractions. If you are not in true labor, the exam should show that your cervix is not dilating and your water has not broken. If there are other health problems associated with your pregnancy, it is completely safe for you to be sent home with false labor. You may continue to have Braxton Hicks contractions until you go into true labor. How to tell the difference between true labor and false labor True labor  Contractions last 30-70 seconds.  Contractions become very regular.  Discomfort is usually felt in the top of the uterus, and it spreads to the lower abdomen and low back.  Contractions do not go away with walking.  Contractions usually become more intense and increase in frequency.  The cervix dilates and gets thinner. False labor  Contractions are usually shorter and not as strong as true labor contractions.  Contractions are usually irregular.  Contractions are often felt in the front of the lower abdomen and in the groin.  Contractions may go away when you walk around or change positions while lying down.  Contractions get weaker and are shorter-lasting as time goes on.  The cervix usually does not dilate or become thin. Follow these instructions at home:  Take over-the-counter and prescription medicines only as told by your health care provider.  Keep up with your usual exercises and follow other instructions from your health care provider.  Eat and drink lightly if you think you are going into labor.  If Braxton Hicks contractions are making you uncomfortable: ? Change your position from lying down or resting to walking, or change from walking to resting. ? Sit and rest in a tub of warm water. ? Drink enough fluid to keep your urine pale yellow. Dehydration may cause these contractions. ? Do slow and deep breathing several times an hour.  Keep all follow-up prenatal visits as told by your health care provider. This is  important. Contact a health care provider if:  You have a fever.  You have continuous pain in your abdomen. Get help right away if:  Your contractions become stronger, more regular, and closer together.  You have fluid leaking or gushing from your vagina.  You pass blood-tinged mucus (bloody show).  You have bleeding from your vagina.  You have low back pain that you never had before.  You feel your baby's head pushing down and causing pelvic pressure.  Your baby is not moving inside you as much as it used to. Summary  Contractions that occur before labor are called Braxton Hicks contractions, false labor, or practice contractions.  Braxton Hicks contractions are usually shorter, weaker, farther apart, and less regular than true labor contractions. True labor contractions usually become progressively stronger and regular and they become more frequent.  Manage discomfort from Braxton Hicks contractions by changing position, resting in a warm bath, drinking plenty of water, or practicing deep breathing. This information is not intended to replace advice given to you by your health care provider. Make sure you discuss any questions you have with your health care provider. Document Released: 05/20/2016 Document Revised: 05/20/2016 Document Reviewed: 05/20/2016 Elsevier Interactive Patient Education  2018 [employer]    

## 2017-08-02 NOTE — Progress Notes (Signed)
   PRENATAL VISIT NOTE  Subjective:  Cassidy Meyer is a 22 y.o. G2P0010 at 3176w5d being seen today for ongoing prenatal care.  She is currently monitored for the following issues for this low-risk pregnancy and has Hearing loss; ECZEMA; Depression; Migraine; Supervision of other normal pregnancy, antepartum; Asthma affecting pregnancy, antepartum; and Tobacco smoking affecting pregnancy, antepartum on their problem list.  Patient reports no complaints.  Contractions: Not present. Vag. Bleeding: None.  Movement: Present. Denies leaking of fluid.   The following portions of the patient's history were reviewed and updated as appropriate: allergies, current medications, past family history, past medical history, past social history, past surgical history and problem list. Problem list updated.  Objective:   Vitals:   08/02/17 1555  BP: 123/82  Pulse: (!) 109  Weight: 192 lb 3.2 oz (87.2 kg)    Fetal Status: Fetal Heart Rate (bpm): 147 Fundal Height: 34 cm Movement: Present     General:  Alert, oriented and cooperative. Patient is in no acute distress.  Skin: Skin is warm and dry. No rash noted.   Cardiovascular: Normal heart rate noted  Respiratory: Normal respiratory effort, no problems with respiration noted  Abdomen: Soft, gravid, appropriate for gestational age.  Pain/Pressure: Present     Pelvic: Cervical exam deferred        Extremities: Normal range of motion.  Edema: None  Mental Status: Normal mood and affect. Normal behavior. Normal judgment and thought content.   Assessment and Plan:  Pregnancy: G2P0010 at 7476w5d  1. Supervision of other normal pregnancy, antepartum - Transfer from [facility] - Discussed need for cultures at next visit  - Patient requested US for EFW, discussed reasons for medical indication for US which patient does not have today. Advised of normal fundal height and estimation of fetal weight based on normal growth - PLT 159 on 06/23/17 - consider repeat CBC at 36  weeks  2. Asthma affecting pregnancy, antepartum - No complaints   Preterm labor symptoms and general obstetric precautions including but not limited to vaginal bleeding, contractions, leaking of fluid and fetal movement were reviewed in detail with the patient. Please refer to After Visit Summary for other counseling recommendations.  Return in about 2 weeks (around 08/16/2017) for LOB.  Vonzella NippleJulie Wenzel, PA-C

## 2017-08-07 ENCOUNTER — Encounter (HOSPITAL_COMMUNITY): Payer: Self-pay | Admitting: *Deleted

## 2017-08-07 ENCOUNTER — Inpatient Hospital Stay (HOSPITAL_COMMUNITY)
Admission: AD | Admit: 2017-08-07 | Discharge: 2017-08-07 | Disposition: A | Discharge status: Home / Self Care | Payer: Managed Care, Other (non HMO) | Source: Ambulatory Visit | Attending: Obstetrics and Gynecology | Admitting: Obstetrics and Gynecology

## 2017-08-07 DIAGNOSIS — Z87891 Personal history of nicotine dependence: Secondary | ICD-10-CM | POA: Diagnosis not present

## 2017-08-07 DIAGNOSIS — O479 False labor, unspecified: Secondary | ICD-10-CM

## 2017-08-07 DIAGNOSIS — Z3A34 34 weeks gestation of pregnancy: Secondary | ICD-10-CM | POA: Diagnosis not present

## 2017-08-07 DIAGNOSIS — O4703 False labor before 37 completed weeks of gestation, third trimester: Secondary | ICD-10-CM | POA: Diagnosis not present

## 2017-08-07 DIAGNOSIS — R109 Unspecified abdominal pain: Secondary | ICD-10-CM | POA: Diagnosis present

## 2017-08-07 DIAGNOSIS — O4700 False labor before 37 completed weeks of gestation, unspecified trimester: Secondary | ICD-10-CM

## 2017-08-07 LAB — URINALYSIS, ROUTINE W REFLEX MICROSCOPIC
BILIRUBIN URINE: NEGATIVE
Glucose, UA: NEGATIVE mg/dL
Ketones, ur: NEGATIVE mg/dL
Leukocytes, UA: NEGATIVE
Nitrite: NEGATIVE
PH: 6 (ref 5.0–8.0)
Protein, ur: NEGATIVE mg/dL
SPECIFIC GRAVITY, URINE: 1.025 (ref 1.005–1.030)

## 2017-08-07 LAB — URINALYSIS, MICROSCOPIC (REFLEX): RBC / HPF: NONE SEEN RBC/hpf (ref 0–5)

## 2017-08-07 NOTE — Discharge Instructions (Signed)
Braxton Hicks Contractions °Contractions of the uterus can occur throughout pregnancy, but they are not always a sign that you are in labor. You may have practice contractions called Braxton Hicks contractions. These false labor contractions are sometimes confused with true labor. °What are Braxton Hicks contractions? °Braxton Hicks contractions are tightening movements that occur in the muscles of the uterus before labor. Unlike true labor contractions, these contractions do not result in opening (dilation) and thinning of the cervix. Toward the end of pregnancy (32-34 weeks), Braxton Hicks contractions can happen more often and may become stronger. These contractions are sometimes difficult to tell apart from true labor because they can be very uncomfortable. You should not feel embarrassed if you go to the hospital with false labor. °Sometimes, the only way to tell if you are in true labor is for your health care provider to look for changes in the cervix. The health care provider will do a physical exam and may monitor your contractions. If you are not in true labor, the exam should show that your cervix is not dilating and your water has not broken. °If there are other health problems associated with your pregnancy, it is completely safe for you to be sent home with false labor. You may continue to have Braxton Hicks contractions until you go into true labor. °How to tell the difference between true labor and false labor °True labor °· Contractions last 30-70 seconds. °· Contractions become very regular. °· Discomfort is usually felt in the top of the uterus, and it spreads to the lower abdomen and low back. °· Contractions do not go away with walking. °· Contractions usually become more intense and increase in frequency. °· The cervix dilates and gets thinner. °False labor °· Contractions are usually shorter and not as strong as true labor contractions. °· Contractions are usually irregular. °· Contractions  are often felt in the front of the lower abdomen and in the groin. °· Contractions may go away when you walk around or change positions while lying down. °· Contractions get weaker and are shorter-lasting as time goes on. °· The cervix usually does not dilate or become thin. °Follow these instructions at home: °· Take over-the-counter and prescription medicines only as told by your health care provider. °· Keep up with your usual exercises and follow other instructions from your health care provider. °· Eat and drink lightly if you think you are going into labor. °· If Braxton Hicks contractions are making you uncomfortable: °? Change your position from lying down or resting to walking, or change from walking to resting. °? Sit and rest in a tub of warm water. °? Drink enough fluid to keep your urine pale yellow. Dehydration may cause these contractions. °? Do slow and deep breathing several times an hour. °· Keep all follow-up prenatal visits as told by your health care provider. This is important. °Contact a health care provider if: °· You have a fever. °· You have continuous pain in your abdomen. °Get help right away if: °· Your contractions become stronger, more regular, and closer together. °· You have fluid leaking or gushing from your vagina. °· You pass blood-tinged mucus (bloody show). °· You have bleeding from your vagina. °· You have low back pain that you never had before. °· You feel your baby’s head pushing down and causing pelvic pressure. °· Your baby is not moving inside you as much as it used to. °Summary °· Contractions that occur before labor are called Braxton   Hicks contractions, false labor, or practice contractions. °· Braxton Hicks contractions are usually shorter, weaker, farther apart, and less regular than true labor contractions. True labor contractions usually become progressively stronger and regular and they become more frequent. °· Manage discomfort from Braxton Hicks contractions by  changing position, resting in a warm bath, drinking plenty of water, or practicing deep breathing. °This information is not intended to replace advice given to you by your health care provider. Make sure you discuss any questions you have with your health care provider. °Document Released: 05/20/2016 Document Revised: 05/20/2016 Document Reviewed: 05/20/2016 °Elsevier Interactive Patient Education © 2018 [employer] ° °Fetal Movement Counts °Patient Name: ________________________________________________ Patient Due Date: ____________________ °What is a fetal movement count? °A fetal movement count is the number of times that you feel your baby move during a certain amount of time. This may also be called a fetal kick count. A fetal movement count is recommended for every pregnant woman. You may be asked to start counting fetal movements as early as week 28 of your pregnancy. °Pay attention to when your baby is most active. You may notice your baby's sleep and wake cycles. You may also notice things that make your baby move more. You should do a fetal movement count: °· When your baby is normally most active. °· At the same time each day. ° °A good time to count movements is while you are resting, after having something to eat and drink. °How do I count fetal movements? °1. Find a quiet, comfortable area. Sit, or lie down on your side. °2. Write down the date, the start time and stop time, and the number of movements that you felt between those two times. Take this information with you to your health care visits. °3. For 2 hours, count kicks, flutters, swishes, rolls, and jabs. You should feel at least 10 movements during 2 hours. °4. You may stop counting after you have felt 10 movements. °5. If you do not feel 10 movements in 2 hours, have something to eat and drink. Then, keep resting and counting for 1 hour. If you feel at least 4 movements during that hour, you may stop counting. °Contact a health care  provider if: °· You feel fewer than 4 movements in 2 hours. °· Your baby is not moving like he or she usually does. °Date: ____________ Start time: ____________ Stop time: ____________ Movements: ____________ °Date: ____________ Start time: ____________ Stop time: ____________ Movements: ____________ °Date: ____________ Start time: ____________ Stop time: ____________ Movements: ____________ °Date: ____________ Start time: ____________ Stop time: ____________ Movements: ____________ °Date: ____________ Start time: ____________ Stop time: ____________ Movements: ____________ °Date: ____________ Start time: ____________ Stop time: ____________ Movements: ____________ °Date: ____________ Start time: ____________ Stop time: ____________ Movements: ____________ °Date: ____________ Start time: ____________ Stop time: ____________ Movements: ____________ °Date: ____________ Start time: ____________ Stop time: ____________ Movements: ____________ °This information is not intended to replace advice given to you by your health care provider. Make sure you discuss any questions you have with your health care provider. °Document Released: 02/03/2006 Document Revised: 09/03/2015 Document Reviewed: 02/13/2015 °Elsevier Interactive Patient Education © 2018 [employer] ° °

## 2017-08-07 NOTE — MAU Note (Signed)
Light bleeding and mucous after intercourse, cramping and pressure. Denies leaking of fluid

## 2017-08-07 NOTE — Progress Notes (Signed)
Written and verbal d/c instructions given and understanding voiced. 

## 2017-08-07 NOTE — MAU Provider Note (Signed)
Chief Complaint:  Vaginal Discharge   First Provider Initiated Contact with Patient 08/07/17 0513     HPI: Cassidy GardnerJessica Meyer is a 22 y.o. G2P0010 at 4769w3d who presents to maternity admissions reporting abdominal cramping and vaginal bleeding. Symptoms began 2 hours ago after having intercourse. Reports pink spotting on toilet paper. Abdominal pain that she describes as tightening. Can't tell how frequently she's feeling the pain.  Denies LOF. Positive fetal movement.   Location: abdomen Quality: tightening Severity: 6/10 in pain scale Duration: 2 hours Context: after intercourse Timing: intermittent but hasn't timed them Modifying factors: none Associated signs and symptoms: vaginal spotting   Pregnancy Course:   Past Medical History:  Diagnosis Date  . Asthma   . Bronchitis    OB History  Gravida Para Term Preterm AB Living  2 0     1    SAB TAB Ectopic Multiple Live Births    1   0      # Outcome Date GA Lbr Len/2nd Weight Sex Delivery Anes PTL Lv  2 Current           1 TAB 2015           Past Surgical History:  Procedure Laterality Date  . APPENDECTOMY    . HERNIA REPAIR     Family History  Problem Relation Age of Onset  . Hypertension Mother   . Cancer Maternal Aunt        bladder  . Dementia Maternal Grandmother   . Diabetes Neg Hx    Social History   Tobacco Use  . Smoking status: Former Smoker    Packs/day: 0.25    Years: 7.00    Pack years: 1.75    Types: Cigarettes  . Smokeless tobacco: Never Used  Substance Use Topics  . Alcohol use: No  . Drug use: No   No Known Allergies Medications Prior to Admission  Medication Sig Dispense Refill Last Dose  . Elastic Bandages & Supports (COMFORT FIT MATERNITY SUPP MED) MISC Wear daily when ambulating (Patient not taking: Reported on 07/14/2017) 1 each 0 Not Taking  . Prenatal Multivit-Min-Fe-FA (PRENATAL VITAMINS PO) Take by mouth.   Taking    I have reviewed patient's Past Medical Hx, Surgical Hx, Family  Hx, Social Hx, medications and allergies.   ROS:  Review of Systems  Constitutional: Negative.   Gastrointestinal: Positive for abdominal pain.  Genitourinary: Positive for vaginal bleeding. Negative for dysuria and vaginal discharge.    Physical Exam   Patient Vitals for the past 24 hrs:  BP Temp Temp src Pulse Resp  08/07/17 0500 114/76 98.6 F (37 C) Oral (!) 101 18   Constitutional: Well-developed, well-nourished female in no acute distress.  Cardiovascular: normal rate Respiratory: normal effort GI: Abd soft, non-tender, gravid appropriate for gestational age. Pos BS x 4 MS: Extremities nontender, no edema, normal ROM Neurologic: Alert and oriented x 4.  GU:  Pelvic: NEFG, physiologic discharge, no blood, cervix clean. No CMT  Dilation: 1 Effacement (%): 60 Station: -3 Exam by:: Judeth HornErin Jamey Harman NP  FHT:  Baseline 135 , moderate variability, accelerations present, no decelerations Contractions: q 5-6 mins   Labs: Results for orders placed or performed during the hospital encounter of 08/07/17 (from the past 24 hour(s))  Urinalysis, Routine w reflex microscopic     Status: Abnormal   Collection Time: 08/07/17  5:00 AM  Result Value Ref Range   Color, Urine YELLOW YELLOW   APPearance CLEAR CLEAR   Specific  Gravity, Urine 1.025 1.005 - 1.030   pH 6.0 5.0 - 8.0   Glucose, UA NEGATIVE NEGATIVE mg/dL   Hgb urine dipstick TRACE (A) NEGATIVE   Bilirubin Urine NEGATIVE NEGATIVE   Ketones, ur NEGATIVE NEGATIVE mg/dL   Protein, ur NEGATIVE NEGATIVE mg/dL   Nitrite NEGATIVE NEGATIVE   Leukocytes, UA NEGATIVE NEGATIVE  Urinalysis, Microscopic (reflex)     Status: Abnormal   Collection Time: 08/07/17  5:00 AM  Result Value Ref Range   RBC / HPF NONE SEEN 0 - 5 RBC/hpf   WBC, UA 0-5 0 - 5 WBC/hpf   Bacteria, UA FEW (A) NONE SEEN   Squamous Epithelial / LPF 0-5 0 - 5   Mucus PRESENT    Ca Oxalate Crys, UA PRESENT     Imaging:  No results found.  MAU Course: Orders  Placed This Encounter  Procedures  . Urinalysis, Routine w reflex microscopic  . Urinalysis, Microscopic (reflex)  . Discharge patient   No orders of the defined types were placed in this encounter.   MDM: No blood on exam. Cervix 1/60/ballotable. Pt states that's what she was dilated last month.  States contractions feel like tightening and are not painful. Will observe for an hour and recheck cervix since she lives so far away.  Ctx spaced out with PO fluids. Cervix unchanged after 1 hr of monitoring.    Assessment: 1. Preterm contractions   2. [redacted] weeks gestation of pregnancy     Plan: Discharge home in stable condition.  preterm Labor precautions and fetal kick counts   Allergies as of 08/07/2017   No Known Allergies     Medication List    TAKE these medications   COMFORT FIT MATERNITY SUPP MED Misc Wear daily when ambulating   PRENATAL VITAMINS PO Take by mouth.       Judeth Horn, NP 08/07/2017 6:27 AM

## 2017-08-14 ENCOUNTER — Inpatient Hospital Stay (HOSPITAL_COMMUNITY)
Admission: AD | Admit: 2017-08-14 | Discharge: 2017-08-14 | Disposition: A | Discharge status: Home / Self Care | Payer: Managed Care, Other (non HMO) | Source: Ambulatory Visit | Attending: Obstetrics & Gynecology | Admitting: Obstetrics & Gynecology

## 2017-08-14 ENCOUNTER — Encounter (HOSPITAL_COMMUNITY): Payer: Self-pay

## 2017-08-14 DIAGNOSIS — O99333 Smoking (tobacco) complicating pregnancy, third trimester: Secondary | ICD-10-CM | POA: Diagnosis not present

## 2017-08-14 DIAGNOSIS — O99519 Diseases of the respiratory system complicating pregnancy, unspecified trimester: Secondary | ICD-10-CM

## 2017-08-14 DIAGNOSIS — G44209 Tension-type headache, unspecified, not intractable: Secondary | ICD-10-CM | POA: Diagnosis not present

## 2017-08-14 DIAGNOSIS — R51 Headache: Secondary | ICD-10-CM | POA: Insufficient documentation

## 2017-08-14 DIAGNOSIS — O99513 Diseases of the respiratory system complicating pregnancy, third trimester: Secondary | ICD-10-CM | POA: Insufficient documentation

## 2017-08-14 DIAGNOSIS — O26893 Other specified pregnancy related conditions, third trimester: Secondary | ICD-10-CM | POA: Diagnosis not present

## 2017-08-14 DIAGNOSIS — Z3A35 35 weeks gestation of pregnancy: Secondary | ICD-10-CM | POA: Diagnosis not present

## 2017-08-14 DIAGNOSIS — J45909 Unspecified asthma, uncomplicated: Secondary | ICD-10-CM | POA: Diagnosis not present

## 2017-08-14 DIAGNOSIS — N898 Other specified noninflammatory disorders of vagina: Secondary | ICD-10-CM | POA: Diagnosis present

## 2017-08-14 DIAGNOSIS — O9989 Other specified diseases and conditions complicating pregnancy, childbirth and the puerperium: Secondary | ICD-10-CM

## 2017-08-14 DIAGNOSIS — Z3480 Encounter for supervision of other normal pregnancy, unspecified trimester: Secondary | ICD-10-CM

## 2017-08-14 DIAGNOSIS — O99330 Smoking (tobacco) complicating pregnancy, unspecified trimester: Secondary | ICD-10-CM

## 2017-08-14 LAB — URINALYSIS, ROUTINE W REFLEX MICROSCOPIC
Bilirubin Urine: NEGATIVE
Glucose, UA: NEGATIVE mg/dL
Hgb urine dipstick: NEGATIVE
Ketones, ur: NEGATIVE mg/dL
NITRITE: NEGATIVE
PH: 7 (ref 5.0–8.0)
Protein, ur: NEGATIVE mg/dL
SPECIFIC GRAVITY, URINE: 1.01 (ref 1.005–1.030)

## 2017-08-14 LAB — WET PREP, GENITAL
SPERM: NONE SEEN
TRICH WET PREP: NONE SEEN
YEAST WET PREP: NONE SEEN

## 2017-08-14 LAB — AMNISURE RUPTURE OF MEMBRANE (ROM) NOT AT ARMC: AMNISURE: NEGATIVE

## 2017-08-14 MED ORDER — DIPHENHYDRAMINE HCL 50 MG/ML IJ SOLN
25.0000 mg | Freq: Once | INTRAMUSCULAR | Status: AC
  Administered 2017-08-14: 25 mg via INTRAVENOUS
  Filled 2017-08-14: qty 1

## 2017-08-14 MED ORDER — METRONIDAZOLE 500 MG PO TABS: 500.0000 mg | ORAL_TABLET | Freq: Two times a day (BID) | ORAL | 0 refills | Status: DC

## 2017-08-14 MED ORDER — SODIUM CHLORIDE 0.9 % IV SOLN
INTRAVENOUS | Status: DC
  Administered 2017-08-14: 20:00:00 via INTRAVENOUS

## 2017-08-14 MED ORDER — METOCLOPRAMIDE HCL 5 MG/ML IJ SOLN
10.0000 mg | Freq: Once | INTRAMUSCULAR | Status: AC
  Administered 2017-08-14: 10 mg via INTRAVENOUS
  Filled 2017-08-14: qty 2

## 2017-08-14 MED ORDER — ACETAMINOPHEN 500 MG PO TABS
1000.0000 mg | ORAL_TABLET | Freq: Once | ORAL | Status: AC
  Administered 2017-08-14: 1000 mg via ORAL
  Filled 2017-08-14: qty 2

## 2017-08-14 MED ORDER — CYCLOBENZAPRINE HCL 10 MG PO TABS: 10.0000 mg | ORAL_TABLET | Freq: Three times a day (TID) | ORAL | 1 refills | Status: DC | PRN

## 2017-08-14 MED ORDER — DEXAMETHASONE SODIUM PHOSPHATE 10 MG/ML IJ SOLN
10.0000 mg | Freq: Once | INTRAMUSCULAR | Status: AC
  Administered 2017-08-14: 10 mg via INTRAVENOUS
  Filled 2017-08-14: qty 1

## 2017-08-14 NOTE — MAU Note (Signed)
Woke up with this AM, did not try any medicine  Dizziness since this AM  Emesis this AM after eating  Some leaking around 1300, none since then, unsure if urine  Increased vaginal discharge, white, some odor  Ctx every few minutes

## 2017-08-14 NOTE — MAU Provider Note (Addendum)
Chief Complaint:  Contractions; Vaginal Discharge; and Headache   First Provider Initiated Contact with Patient 08/14/17 1837     HPI: Cassidy Meyer is a 22 y.o. G2P0010 at [redacted]w[redacted]d who presents to maternity admissions reporting headache, contractions, and vaginal discharge. Reports headache since last night. Frontal headache. Hasn't treated symptoms. Has episodes of dizziness with headaches especially when walking. Pain does not radiate. Rates 10/10. Had one episode of vomiting this morning but has not continued to be nauseated. Denies visual disturbance, epigastric pain, or hx of HTN.  Contractions since this morning. Can't tell how frequently. Rates pain 7/10. Noticed increase in vaginal discharge & had an episode of watery discharge. Unsure if water broke. No vaginal bleeding. No recent intercourse. Positive fetal movement.   Pregnancy Course:   Past Medical History:  Diagnosis Date  . Asthma   . Bronchitis    OB History  Gravida Para Term Preterm AB Living  2 0     1    SAB TAB Ectopic Multiple Live Births    1   0      # Outcome Date GA Lbr Len/2nd Weight Sex Delivery Anes PTL Lv  2 Current           1 TAB 2015           Past Surgical History:  Procedure Laterality Date  . APPENDECTOMY    . HERNIA REPAIR     Family History  Problem Relation Age of Onset  . Hypertension Mother   . Cancer Maternal Aunt        bladder  . Dementia Maternal Grandmother   . Diabetes Neg Hx    Social History   Tobacco Use  . Smoking status: Former Smoker    Packs/day: 0.25    Years: 7.00    Pack years: 1.75    Types: Cigarettes  . Smokeless tobacco: Never Used  Substance Use Topics  . Alcohol use: No  . Drug use: No   No Known Allergies Medications Prior to Admission  Medication Sig Dispense Refill Last Dose  . Elastic Bandages & Supports (COMFORT FIT MATERNITY SUPP MED) MISC Wear daily when ambulating (Patient not taking: Reported on 07/14/2017) 1 each 0 Not Taking  . Prenatal  Multivit-Min-Fe-FA (PRENATAL VITAMINS PO) Take by mouth.   Taking    I have reviewed patient's Past Medical Hx, Surgical Hx, Family Hx, Social Hx, medications and allergies.   ROS:  Review of Systems  Constitutional: Negative.   Eyes: Negative for visual disturbance.  Gastrointestinal: Positive for abdominal pain and vomiting (x1 this morning). Negative for nausea.  Genitourinary: Positive for vaginal discharge. Negative for dysuria and vaginal bleeding.  Neurological: Positive for dizziness and headaches.    Physical Exam   Patient Vitals for the past 24 hrs:  BP Temp Temp src Pulse Resp Weight  08/14/17 1802 135/75 98.6 F (37 C) Oral 96 18 199 lb (90.3 kg)    Constitutional: Well-developed, well-nourished female in no acute distress.  Cardiovascular: normal rate & rhythm, no murmur Respiratory: normal effort, lung sounds clear throughout GI: Abd soft, non-tender, gravid appropriate for gestational age. Pos BS x 4 MS: Extremities nontender, no edema, normal ROM Neurologic: Alert and oriented x 4.  GU:      Pelvic: NEFG, physiologic discharge, no blood, cervix clean.   Dilation: 1 Effacement (%): 60 Cervical Position: Anterior Station: -3 Presentation: Vertex Exam by:: Judeth Horn NP  NST:  Baseline: 140 bpm, Variability: Good {> 6 bpm), Accelerations:  Reactive and Decelerations: Absent   Labs: Results for orders placed or performed during the hospital encounter of 08/14/17 (from the past 24 hour(s))  Urinalysis, Routine w reflex microscopic     Status: Abnormal   Collection Time: 08/14/17  6:09 PM  Result Value Ref Range   Color, Urine YELLOW YELLOW   APPearance CLEAR CLEAR   Specific Gravity, Urine 1.010 1.005 - 1.030   pH 7.0 5.0 - 8.0   Glucose, UA NEGATIVE NEGATIVE mg/dL   Hgb urine dipstick NEGATIVE NEGATIVE   Bilirubin Urine NEGATIVE NEGATIVE   Ketones, ur NEGATIVE NEGATIVE mg/dL   Protein, ur NEGATIVE NEGATIVE mg/dL   Nitrite NEGATIVE NEGATIVE    Leukocytes, UA SMALL (A) NEGATIVE   RBC / HPF 0-5 0 - 5 RBC/hpf   WBC, UA 11-20 0 - 5 WBC/hpf   Bacteria, UA RARE (A) NONE SEEN   Squamous Epithelial / LPF 11-20 0 - 5   Mucus PRESENT   Amnisure rupture of membrane (rom)not [facility]     Status: None   Collection Time: 08/14/17  6:59 PM  Result Value Ref Range   Amnisure ROM NEGATIVE   Wet prep, genital     Status: Abnormal   Collection Time: 08/14/17  6:59 PM  Result Value Ref Range   Yeast Wet Prep HPF POC NONE SEEN NONE SEEN   Trich, Wet Prep NONE SEEN NONE SEEN   Clue Cells Wet Prep HPF POC PRESENT (A) NONE SEEN   WBC, Wet Prep HPF POC FEW (A) NONE SEEN   Sperm NONE SEEN     Imaging:  No results found.  MAU Course: Orders Placed This Encounter  Procedures  . Wet prep, genital  . Urinalysis, Routine w reflex microscopic  . Am[facility] (rom[facility]   Meds ordered this encounter  Medications  . acetaminophen (TYLENOL) tablet 1,000 mg    MDM: Cervix unchanged from previous visit Discussed tx of headache: pt wants to start with tylenol, will accept headache cocktail if h/a not improved Amnisure negative, wet prep + clue cells  Pt reports no relief in headache with tylenol. Discussed further treatment option. Patient agreeable to IV headache cocktail.  Care [facility]    Judeth HornLawrence, Erin, NP 08/14/2017 8:16 PM    Patient has had complete relief with HA cocktail. Pain is now a 0/10. Will discharge home with RX for flexeril PRN.   1. Acute non intractable tension-type headache   2. Supervision of other normal pregnancy, antepartum   3. Asthma affecting pregnancy, antepartum   4. Tobacco smoking affecting pregnancy, antepartum    2. Patient stable for discharge with RX with Flagyl and Flexeril.   3. Patient to keep appt on 7-30.   4. Reviewed warning signs and when to return to MAU. All questions answered; patient desires discharge.   Luna KitchensKathryn Haneefah Venturini

## 2017-08-14 NOTE — Discharge Instructions (Signed)

## 2017-08-16 ENCOUNTER — Ambulatory Visit (INDEPENDENT_AMBULATORY_CARE_PROVIDER_SITE_OTHER): Payer: Managed Care, Other (non HMO) | Admitting: Nurse Practitioner

## 2017-08-16 ENCOUNTER — Other Ambulatory Visit (HOSPITAL_COMMUNITY)
Admission: RE | Admit: 2017-08-16 | Discharge: 2017-08-16 | Disposition: A | Discharge status: Home / Self Care | Payer: Managed Care, Other (non HMO) | Source: Ambulatory Visit | Attending: Nurse Practitioner | Admitting: Nurse Practitioner

## 2017-08-16 ENCOUNTER — Other Ambulatory Visit: Payer: Self-pay

## 2017-08-16 VITALS — BP 136/79 | HR 96 | Wt 200.0 lb

## 2017-08-16 DIAGNOSIS — Z3480 Encounter for supervision of other normal pregnancy, unspecified trimester: Secondary | ICD-10-CM

## 2017-08-16 DIAGNOSIS — Z3483 Encounter for supervision of other normal pregnancy, third trimester: Secondary | ICD-10-CM | POA: Insufficient documentation

## 2017-08-16 NOTE — Progress Notes (Signed)
    Subjective:  Cassidy GardnerJessica Meyer is a 22 y.o. G2P0010 at 8950w5d being seen today for ongoing prenatal care.  She is currently monitored for the following issues for this low-risk pregnancy and has Hearing loss; ECZEMA; Depression; Migraine; Supervision of other normal pregnancy, antepartum; Asthma affecting pregnancy, antepartum; and Tobacco smoking affecting pregnancy, antepartum on their problem list.  Patient reports no complaints.  Contractions: Irritability. Vag. Bleeding: None.  Movement: Present. Denies leaking of fluid.   The following portions of the patient's history were reviewed and updated as appropriate: allergies, current medications, past family history, past medical history, past social history, past surgical history and problem list. Problem list updated.  Objective:   Vitals:   08/16/17 1624  BP: 136/79  Pulse: 96  Weight: 200 lb (90.7 kg)    Fetal Status: Fetal Heart Rate (bpm): 140 Fundal Height: 38 cm Movement: Present     General:  Alert, oriented and cooperative. Patient is in no acute distress.  Skin: Skin is warm and dry. No rash noted.   Cardiovascular: Normal heart rate noted  Respiratory: Normal respiratory effort, no problems with respiration noted  Abdomen: Soft, gravid, appropriate for gestational age. Pain/Pressure: Present     Pelvic:  Cervical exam deferred      GC/Chlam and GBS swabs done  Extremities: Normal range of motion.  Edema: Trace  Mental Status: Normal mood and affect. Normal behavior. Normal judgment and thought content.   Urinalysis:      Assessment and Plan:  Pregnancy: G2P0010 at 1850w5d  1. Supervision of other normal pregnancy, antepartum Will need CBC at next visit to check platelets  - Cervicovaginal ancillary only - Strep Gp B NAA  Preterm labor symptoms and general obstetric precautions including but not limited to vaginal bleeding, contractions, leaking of fluid and fetal movement were reviewed in detail with the  patient. Please refer to After Visit Summary for other counseling recommendations.  Return in about 1 week (around 08/23/2017).  Nolene BernheimERRI BURLESON, RN, MSN, NP-BC Nurse Practitioner, [facility], [facility] 08/16/2017 4:57 PM

## 2017-08-16 NOTE — Patient Instructions (Signed)
Braxton Hicks Contractions °Contractions of the uterus can occur throughout pregnancy, but they are not always a sign that you are in labor. You may have practice contractions called Braxton Hicks contractions. These false labor contractions are sometimes confused with true labor. °What are Braxton Hicks contractions? °Braxton Hicks contractions are tightening movements that occur in the muscles of the uterus before labor. Unlike true labor contractions, these contractions do not result in opening (dilation) and thinning of the cervix. Toward the end of pregnancy (32-34 weeks), Braxton Hicks contractions can happen more often and may become stronger. These contractions are sometimes difficult to tell apart from true labor because they can be very uncomfortable. You should not feel embarrassed if you go to the hospital with false labor. °Sometimes, the only way to tell if you are in true labor is for your health care provider to look for changes in the cervix. The health care provider will do a physical exam and may monitor your contractions. If you are not in true labor, the exam should show that your cervix is not dilating and your water has not broken. °If there are other health problems associated with your pregnancy, it is completely safe for you to be sent home with false labor. You may continue to have Braxton Hicks contractions until you go into true labor. °How to tell the difference between true labor and false labor °True labor °· Contractions last 30-70 seconds. °· Contractions become very regular. °· Discomfort is usually felt in the top of the uterus, and it spreads to the lower abdomen and low back. °· Contractions do not go away with walking. °· Contractions usually become more intense and increase in frequency. °· The cervix dilates and gets thinner. °False labor °· Contractions are usually shorter and not as strong as true labor contractions. °· Contractions are usually irregular. °· Contractions  are often felt in the front of the lower abdomen and in the groin. °· Contractions may go away when you walk around or change positions while lying down. °· Contractions get weaker and are shorter-lasting as time goes on. °· The cervix usually does not dilate or become thin. °Follow these instructions at home: °· Take over-the-counter and prescription medicines only as told by your health care provider. °· Keep up with your usual exercises and follow other instructions from your health care provider. °· Eat and drink lightly if you think you are going into labor. °· If Braxton Hicks contractions are making you uncomfortable: °? Change your position from lying down or resting to walking, or change from walking to resting. °? Sit and rest in a tub of warm water. °? Drink enough fluid to keep your urine pale yellow. Dehydration may cause these contractions. °? Do slow and deep breathing several times an hour. °· Keep all follow-up prenatal visits as told by your health care provider. This is important. °Contact a health care provider if: °· You have a fever. °· You have continuous pain in your abdomen. °Get help right away if: °· Your contractions become stronger, more regular, and closer together. °· You have fluid leaking or gushing from your vagina. °· You pass blood-tinged mucus (bloody show). °· You have bleeding from your vagina. °· You have low back pain that you never had before. °· You feel your baby’s head pushing down and causing pelvic pressure. °· Your baby is not moving inside you as much as it used to. °Summary °· Contractions that occur before labor are called Braxton   Hicks contractions, false labor, or practice contractions. °· Braxton Hicks contractions are usually shorter, weaker, farther apart, and less regular than true labor contractions. True labor contractions usually become progressively stronger and regular and they become more frequent. °· Manage discomfort from Braxton Hicks contractions by  changing position, resting in a warm bath, drinking plenty of water, or practicing deep breathing. °This information is not intended to replace advice given to you by your health care provider. Make sure you discuss any questions you have with your health care provider. °Document Released: 05/20/2016 Document Revised: 05/20/2016 Document Reviewed: 05/20/2016 °Elsevier Interactive Patient Education © 2018 [employer] ° °

## 2017-08-18 ENCOUNTER — Encounter: Payer: Self-pay | Admitting: Nurse Practitioner

## 2017-08-18 DIAGNOSIS — O99820 Streptococcus B carrier state complicating pregnancy: Secondary | ICD-10-CM | POA: Insufficient documentation

## 2017-08-18 LAB — STREP GP B NAA: Strep Gp B NAA: POSITIVE — AB

## 2017-08-19 LAB — CERVICOVAGINAL ANCILLARY ONLY
Chlamydia: NEGATIVE
Neisseria Gonorrhea: NEGATIVE

## 2017-08-23 ENCOUNTER — Ambulatory Visit (INDEPENDENT_AMBULATORY_CARE_PROVIDER_SITE_OTHER): Payer: Managed Care, Other (non HMO) | Admitting: Obstetrics

## 2017-08-23 ENCOUNTER — Encounter: Payer: Self-pay | Admitting: Obstetrics

## 2017-08-23 ENCOUNTER — Other Ambulatory Visit: Payer: Self-pay

## 2017-08-23 VITALS — BP 125/78 | HR 104 | Wt 207.0 lb

## 2017-08-23 DIAGNOSIS — Z3483 Encounter for supervision of other normal pregnancy, third trimester: Secondary | ICD-10-CM

## 2017-08-23 DIAGNOSIS — D696 Thrombocytopenia, unspecified: Secondary | ICD-10-CM

## 2017-08-23 NOTE — Progress Notes (Signed)
Subjective:  Cassidy Meyer is a 22 y.o. G2P0010 at 5932w5d being seen today for ongoing prenatal care.  She is currently monitored for the following issues for this low-risk pregnancy and has Hearing loss; ECZEMA; Depression; Migraine; Supervision of other normal pregnancy, antepartum; Asthma affecting pregnancy, antepartum; Tobacco smoking affecting pregnancy, antepartum; and Group B streptococcal carriage complicating pregnancy on their problem list.  Patient reports no complaints.  Contractions: Irritability. Vag. Bleeding: None.  Movement: Present. Denies leaking of fluid.   The following portions of the patient's history were reviewed and updated as appropriate: allergies, current medications, past family history, past medical history, past social history, past surgical history and problem list. Problem list updated.  Objective:   Vitals:   08/23/17 1612  BP: 125/78  Pulse: (!) 104  Weight: 207 lb (93.9 kg)    Fetal Status:     Movement: Present     General:  Alert, oriented and cooperative. Patient is in no acute distress.  Skin: Skin is warm and dry. No rash noted.   Cardiovascular: Normal heart rate noted  Respiratory: Normal respiratory effort, no problems with respiration noted  Abdomen: Soft, gravid, appropriate for gestational age. Pain/Pressure: Present     Pelvic:  Cervical exam deferred        Extremities: Normal range of motion.  Edema: Trace  Mental Status: Normal mood and affect. Normal behavior. Normal judgment and thought content.   Urinalysis:      Assessment and Plan:  Pregnancy: G2P0010 at 5432w5d  1. Encounter for supervision of other normal pregnancy in third trimester  2. Platelets decreased (HCC) Rx: - CBC  Term labor symptoms and general obstetric precautions including but not limited to vaginal bleeding, contractions, leaking of fluid and fetal movement were reviewed in detail with the patient. Please refer to After Visit Summary for other counseling  recommendations.  Return in about 1 week (around 08/30/2017) for ROB.   Brock BadHarper, Margo Lama A, MD

## 2017-08-23 NOTE — Patient Instructions (Signed)
Group B Streptococcus Infection During Pregnancy Group B Streptococcus (GBS) is a type of bacteria (Streptococcus agalactiae) that is often found in healthy people, commonly in the rectum, vagina, and intestines. In people who are healthy and not pregnant, the bacteria rarely cause serious illness or complications. However, women who test positive for GBS during pregnancy can pass the bacteria to their baby during childbirth, which can cause serious infection in the baby after birth. Women with GBS may also have infections during their pregnancy or immediately after childbirth, such as such as urinary tract infections (UTIs) or infections of the uterus (uterine infections). Having GBS also increases a woman's risk of complications during pregnancy, such as early (preterm) labor or delivery, miscarriage, or stillbirth. Routine testing (screening) for GBS is recommended for all pregnant women. What increases the risk? You may have a higher risk for GBS infection during pregnancy if you had one during a past pregnancy. What are the signs or symptoms? In most cases, GBS infection does not cause symptoms in pregnant women. Signs and symptoms of a possible GBS-related infection may include:  Labor starting before the 37th week of pregnancy.  A UTI or bladder infection, which may cause: ? Fever. ? Pain or burning during urination. ? Frequent urination.  Fever during labor, along with: ? Bad-smelling discharge. ? Uterine tenderness. ? Rapid heartbeat in the mother, baby, or both.  Rare but serious symptoms of a possible GBS-related infection in women include:  Blood infection (septicemia). This may cause fever, chills, or confusion.  Lung infection (pneumonia). This may cause fever, chills, cough, rapid breathing, difficulty breathing, or chest pain.  Bone, joint, skin, or soft tissue infection.  How is this diagnosed? You may be screened for GBS between week 35 and week 37 of your pregnancy. If  you have symptoms of preterm labor, you may be screened earlier. This condition is diagnosed based on lab test results from:  A swab of fluid from the vagina and rectum.  A urine sample.  How is this treated? This condition is treated with antibiotic medicine. When you go into labor, or as soon as your water breaks (your membranes rupture), you will be given antibiotics through an IV tube. Antibiotics will continue until after you give birth. If you are having a cesarean delivery, you do not need antibiotics unless your membranes have already ruptured. Follow these instructions at home:  Take over-the-counter and prescription medicines only as told by your health care provider.  Take your antibiotic medicine as told by your health care provider. Do not stop taking the antibiotic even if you start to feel better.  Keep all pre-birth (prenatal) visits and follow-up visits as told by your health care provider. This is important. Contact a health care provider if:  You have pain or burning when you urinate.  You have to urinate frequently.  You have a fever or chills.  You develop a bad-smelling vaginal discharge. Get help right away if:  Your membranes rupture.  You go into labor.  You have severe pain in your abdomen.  You have difficulty breathing.  You have chest pain. This information is not intended to replace advice given to you by your health care provider. Make sure you discuss any questions you have with your health care provider. Document Released: 04/13/2007 Document Revised: 08/01/2015 Document Reviewed: 07/31/2015 Elsevier Interactive Patient Education  2018 Elsevier Inc.  

## 2017-08-24 ENCOUNTER — Other Ambulatory Visit: Payer: Self-pay | Admitting: Obstetrics

## 2017-08-24 DIAGNOSIS — O99119 Other diseases of the blood and blood-forming organs and certain disorders involving the immune mechanism complicating pregnancy, unspecified trimester: Principal | ICD-10-CM

## 2017-08-24 DIAGNOSIS — D696 Thrombocytopenia, unspecified: Secondary | ICD-10-CM

## 2017-08-24 LAB — CBC
HEMOGLOBIN: 10.8 g/dL — AB (ref 11.1–15.9)
Hematocrit: 33 % — ABNORMAL LOW (ref 34.0–46.6)
MCH: 26.9 pg (ref 26.6–33.0)
MCHC: 32.7 g/dL (ref 31.5–35.7)
MCV: 82 fL (ref 79–97)
Platelets: 141 10*3/uL — ABNORMAL LOW (ref 150–450)
RBC: 4.01 x10E6/uL (ref 3.77–5.28)
RDW: 12.6 % (ref 12.3–15.4)
WBC: 8.9 10*3/uL (ref 3.4–10.8)

## 2017-08-29 ENCOUNTER — Encounter (HOSPITAL_COMMUNITY): Payer: Self-pay

## 2017-08-29 ENCOUNTER — Other Ambulatory Visit: Payer: Self-pay

## 2017-08-29 ENCOUNTER — Inpatient Hospital Stay (EMERGENCY_DEPARTMENT_HOSPITAL)
Admission: AD | Admit: 2017-08-29 | Discharge: 2017-08-30 | Disposition: A | Discharge status: Home / Self Care | Payer: Managed Care, Other (non HMO) | Source: Ambulatory Visit | Attending: Obstetrics and Gynecology | Admitting: Obstetrics and Gynecology

## 2017-08-29 ENCOUNTER — Inpatient Hospital Stay (EMERGENCY_DEPARTMENT_HOSPITAL)
Admission: AD | Admit: 2017-08-29 | Discharge: 2017-08-29 | Disposition: A | Discharge status: Home / Self Care | Payer: Managed Care, Other (non HMO) | Source: Ambulatory Visit | Attending: Obstetrics and Gynecology | Admitting: Obstetrics and Gynecology

## 2017-08-29 DIAGNOSIS — O26893 Other specified pregnancy related conditions, third trimester: Secondary | ICD-10-CM | POA: Diagnosis not present

## 2017-08-29 DIAGNOSIS — Z3689 Encounter for other specified antenatal screening: Secondary | ICD-10-CM

## 2017-08-29 DIAGNOSIS — Z3A37 37 weeks gestation of pregnancy: Secondary | ICD-10-CM | POA: Diagnosis not present

## 2017-08-29 DIAGNOSIS — Z0371 Encounter for suspected problem with amniotic cavity and membrane ruled out: Secondary | ICD-10-CM

## 2017-08-29 DIAGNOSIS — N898 Other specified noninflammatory disorders of vagina: Secondary | ICD-10-CM | POA: Diagnosis not present

## 2017-08-29 DIAGNOSIS — O4292 Full-term premature rupture of membranes, unspecified as to length of time between rupture and onset of labor: Secondary | ICD-10-CM | POA: Diagnosis not present

## 2017-08-29 HISTORY — DX: Headache: R51

## 2017-08-29 HISTORY — DX: Headache, unspecified: R51.9

## 2017-08-29 LAB — POCT FERN TEST: POCT Fern Test: NEGATIVE

## 2017-08-29 NOTE — MAU Note (Signed)
Patient states she was evaluated earlier for SROM and everything was negative.  She is continuing to have a lot of "discharge" that she feels like is her water.  Reports irregular contractions.  No VB.   Has not felt baby move since 1400.  Patient requesting an ultrasound to determine AFI.

## 2017-08-29 NOTE — Progress Notes (Signed)
Blind sweep of vaginal vault using sterile cotton swab to collect specimen for fern test.

## 2017-08-29 NOTE — MAU Note (Signed)
States around 1130 today began leaking small amounts clear fluid. No odor,; does not have on pad. States irregular contractions, no more than usual. Denies vaginal bleeding.

## 2017-08-29 NOTE — Discharge Instructions (Signed)

## 2017-08-29 NOTE — MAU Provider Note (Signed)
None      S: Ms. Cassidy Meyer is a 22 y.o. G2P0010 at 2542w4d  who presents to MAU today complaining of leaking of fluid since this morning. She denies vaginal bleeding. She endorses contractions. She reports normal fetal movement.    O: BP 139/75 (BP Location: Right Arm)   Pulse (!) 108   Temp 98.4 F (36.9 C) (Oral)   Resp 18   Ht 5\' 3"  (1.6 m)   Wt 94.7 kg   LMP 12/15/2016 (Within Weeks)   BMI 36.98 kg/m  GENERAL: Well-developed, well-nourished female in no acute distress.  HEAD: Normocephalic, atraumatic.  CHEST: Normal effort of breathing, regular heart rate ABDOMEN: Soft, nontender, gravid PELVIC: Normal external female genitalia. Vagina is pink and rugated. Cervix with normal contour, no lesions. Normal discharge.  Negative pooling.   Cervical exam:  1.5 cm, 50 %, -2, vertex   Fetal Monitoring: Baseline: 150 bpm Variability: Moderate Accelerations: 15x15 Decelerations: None Contractions: irregular pattern   Results for orders placed or performed during the hospital encounter of 08/29/17 (from the past 24 hour(s))  POCT fern test     Status: None   Collection Time: 08/29/17  3:18 PM  Result Value Ref Range   POCT Fern Test Negative = intact amniotic membranes      A: SIUP at 3142w4d  Membranes intact  P: Discharge home in stable condition  Return to [facility] if symptoms worsen  Follow up with OB   Duane Lopeasch, Garmon Dehn I, NP 08/29/2017 3:25 PM

## 2017-08-30 ENCOUNTER — Telehealth: Payer: Self-pay

## 2017-08-30 DIAGNOSIS — Z3689 Encounter for other specified antenatal screening: Secondary | ICD-10-CM

## 2017-08-30 DIAGNOSIS — O26893 Other specified pregnancy related conditions, third trimester: Secondary | ICD-10-CM | POA: Diagnosis not present

## 2017-08-30 DIAGNOSIS — Z3A37 37 weeks gestation of pregnancy: Secondary | ICD-10-CM

## 2017-08-30 DIAGNOSIS — N898 Other specified noninflammatory disorders of vagina: Secondary | ICD-10-CM | POA: Diagnosis not present

## 2017-08-30 LAB — AMNISURE RUPTURE OF MEMBRANE (ROM) NOT AT ARMC: Amnisure ROM: NEGATIVE

## 2017-08-30 LAB — POCT FERN TEST: POCT Fern Test: NEGATIVE

## 2017-08-30 NOTE — Telephone Encounter (Signed)
Return call to patient regarding message. Pt left voice mail she was experiencing leaking. Pt was seen at MAU for complaints and discharged no ROM. -Neg Fern - Neg Amnisure.   pt denies any leaking "today". +FM, no bleeding ,no contractions, no edema, no back pain , no nausea/vomiting. Pt states she was told at the hospital they could not do a U/S as she requested. Pt is requesting U/S pt advised if no clinical indication for a U/S no U/S will be ordered.   Pt advised to discuss w/ provider Dr.Ervin at appt on 08/31/17.

## 2017-08-30 NOTE — Discharge Instructions (Signed)
Braxton Hicks Contractions °Contractions of the uterus can occur throughout pregnancy, but they are not always a sign that you are in labor. You may have practice contractions called Braxton Hicks contractions. These false labor contractions are sometimes confused with true labor. °What are Braxton Hicks contractions? °Braxton Hicks contractions are tightening movements that occur in the muscles of the uterus before labor. Unlike true labor contractions, these contractions do not result in opening (dilation) and thinning of the cervix. Toward the end of pregnancy (32-34 weeks), Braxton Hicks contractions can happen more often and may become stronger. These contractions are sometimes difficult to tell apart from true labor because they can be very uncomfortable. You should not feel embarrassed if you go to the hospital with false labor. °Sometimes, the only way to tell if you are in true labor is for your health care provider to look for changes in the cervix. The health care provider will do a physical exam and may monitor your contractions. If you are not in true labor, the exam should show that your cervix is not dilating and your water has not broken. °If there are other health problems associated with your pregnancy, it is completely safe for you to be sent home with false labor. You may continue to have Braxton Hicks contractions until you go into true labor. °How to tell the difference between true labor and false labor °True labor °· Contractions last 30-70 seconds. °· Contractions become very regular. °· Discomfort is usually felt in the top of the uterus, and it spreads to the lower abdomen and low back. °· Contractions do not go away with walking. °· Contractions usually become more intense and increase in frequency. °· The cervix dilates and gets thinner. °False labor °· Contractions are usually shorter and not as strong as true labor contractions. °· Contractions are usually irregular. °· Contractions  are often felt in the front of the lower abdomen and in the groin. °· Contractions may go away when you walk around or change positions while lying down. °· Contractions get weaker and are shorter-lasting as time goes on. °· The cervix usually does not dilate or become thin. °Follow these instructions at home: °· Take over-the-counter and prescription medicines only as told by your health care provider. °· Keep up with your usual exercises and follow other instructions from your health care provider. °· Eat and drink lightly if you think you are going into labor. °· If Braxton Hicks contractions are making you uncomfortable: °? Change your position from lying down or resting to walking, or change from walking to resting. °? Sit and rest in a tub of warm water. °? Drink enough fluid to keep your urine pale yellow. Dehydration may cause these contractions. °? Do slow and deep breathing several times an hour. °· Keep all follow-up prenatal visits as told by your health care provider. This is important. °Contact a health care provider if: °· You have a fever. °· You have continuous pain in your abdomen. °Get help right away if: °· Your contractions become stronger, more regular, and closer together. °· You have fluid leaking or gushing from your vagina. °· You pass blood-tinged mucus (bloody show). °· You have bleeding from your vagina. °· You have low back pain that you never had before. °· You feel your baby’s head pushing down and causing pelvic pressure. °· Your baby is not moving inside you as much as it used to. °Summary °· Contractions that occur before labor are called Braxton   Hicks contractions, false labor, or practice contractions. °· Braxton Hicks contractions are usually shorter, weaker, farther apart, and less regular than true labor contractions. True labor contractions usually become progressively stronger and regular and they become more frequent. °· Manage discomfort from Braxton Hicks contractions by  changing position, resting in a warm bath, drinking plenty of water, or practicing deep breathing. °This information is not intended to replace advice given to you by your health care provider. Make sure you discuss any questions you have with your health care provider. °Document Released: 05/20/2016 Document Revised: 05/20/2016 Document Reviewed: 05/20/2016 °Elsevier Interactive Patient Education © 2018 [employer] ° °

## 2017-08-30 NOTE — MAU Note (Signed)
Patient reports feeling at least 15 movements since being in MAU.

## 2017-08-30 NOTE — MAU Provider Note (Signed)
History   [id number]   Chief Complaint  Patient presents with  . Rupture of Membranes    HPI Stacy GardnerJessica Doughman is a 22 y.o. female  G2P0010 @37 .5 wks here with report of leaking clear fluid since 11am.  Leaking of fluid has continued. Pt reports rare contractions. She denies vaginal bleeding. Last intercourse was not recent. She reports good fetal movement. All other systems negative. She was seen earlier on MAU for same, no ROM found.   Patient's last menstrual period was 12/15/2016 (within weeks).  OB History  Gravida Para Term Preterm AB Living  2 0     1    SAB TAB Ectopic Multiple Live Births    1   0      # Outcome Date GA Lbr Len/2nd Weight Sex Delivery Anes PTL Lv  2 Current           1 TAB 2015            Past Medical History:  Diagnosis Date  . Asthma   . Bronchitis   . Headache     Family History  Problem Relation Age of Onset  . Hypertension Mother   . Cancer Maternal Aunt        bladder  . Dementia Maternal Grandmother   . Diabetes Neg Hx     Social History   Socioeconomic History  . Marital status: Single    Spouse name: Not on file  . Number of children: Not on file  . Years of education: Not on file  . Highest education level: Not on file  Occupational History  . Not on file  Social Needs  . Financial resource strain: Not on file  . Food insecurity:    Worry: Never true    Inability: Never true  . Transportation needs:    Medical: No    Non-medical: No  Tobacco Use  . Smoking status: Former Smoker    Packs/day: 0.25    Years: 7.00    Pack years: 1.75    Types: Cigarettes  . Smokeless tobacco: Never Used  Substance and Sexual Activity  . Alcohol use: No  . Drug use: No  . Sexual activity: Yes    Birth control/protection: None  Lifestyle  . Physical activity:    Days per week: 0 days    Minutes per session: 0 min  . Stress: Not at all  Relationships  . Social connections:    Talks on phone: More than three times a week   Gets together: Once a week    Attends religious service: 1 to 4 times per year    Active member of club or organization: No    Attends meetings of clubs or organizations: Never    Relationship status: Living with partner  Other Topics Concern  . Not on file  Social History Narrative  . Not on file    No Known Allergies  No current facility-administered medications on file prior to encounter.    Current Outpatient Medications on File Prior to Encounter  Medication Sig Dispense Refill  . Elastic Bandages & Supports (COMFORT FIT MATERNITY SUPP MED) MISC Wear daily when ambulating (Patient not taking: Reported on 07/14/2017) 1 each 0  . Prenatal Multivit-Min-Fe-FA (PRENATAL VITAMINS PO) Take 1 tablet by mouth daily.        Review of Systems  Gastrointestinal: Negative for abdominal pain.  Genitourinary: Positive for vaginal discharge. Negative for vaginal bleeding.     Physical Exam  Vitals:   08/29/17 2327 08/29/17 2333  BP:  127/76  Pulse:  99  Resp:  19  Temp:  98.5 F (36.9 C)  Weight: 95.8 kg   Height: 5\' 3"  (1.6 m)     Physical Exam  Constitutional: She is oriented to person, place, and time. She appears well-developed and well-nourished. No distress.  HENT:  Head: Normocephalic and atraumatic.  Neck: Normal range of motion.  Cardiovascular: Normal rate.  Respiratory: Effort normal. No respiratory distress.  Musculoskeletal: Normal range of motion.  Neurological: She is alert and oriented to person, place, and time.  Skin: Skin is warm and dry.  Psychiatric: She has a normal mood and affect.  EFM: 145 bpm, mod variability, + accels, no decels Toco: 1-6  MAU Course  Procedures  MDM No evidence of ROM. Stable for discharge home.   Assessment and Plan   1. [redacted] weeks gestation of pregnancy   2. NST (non-stress test) reactive   3. Vaginal discharge during pregnancy in third trimester    Discharge home Follow up in OB office as scheduled Labor  precautions  Allergies as of 08/30/2017   No Known Allergies     Medication List    TAKE these medications   COMFORT FIT MATERNITY SUPP MED Misc Wear daily when ambulating   PRENATAL VITAMINS PO Take 1 tablet by mouth daily.      Donette LarryBhambri, Graig Hessling, CNM 08/30/2017 12:04 AM

## 2017-08-31 ENCOUNTER — Ambulatory Visit (INDEPENDENT_AMBULATORY_CARE_PROVIDER_SITE_OTHER): Payer: Managed Care, Other (non HMO) | Admitting: Obstetrics and Gynecology

## 2017-08-31 ENCOUNTER — Encounter: Payer: Self-pay | Admitting: Obstetrics and Gynecology

## 2017-08-31 ENCOUNTER — Inpatient Hospital Stay (HOSPITAL_COMMUNITY)
Admission: AD | Admit: 2017-08-31 | Discharge: 2017-08-31 | Disposition: A | Discharge status: Home / Self Care | Payer: Managed Care, Other (non HMO) | Source: Ambulatory Visit | Attending: Obstetrics and Gynecology | Admitting: Obstetrics and Gynecology

## 2017-08-31 ENCOUNTER — Encounter (HOSPITAL_COMMUNITY): Payer: Self-pay

## 2017-08-31 VITALS — BP 133/86 | HR 102 | Wt 208.8 lb

## 2017-08-31 DIAGNOSIS — J45909 Unspecified asthma, uncomplicated: Secondary | ICD-10-CM

## 2017-08-31 DIAGNOSIS — O99519 Diseases of the respiratory system complicating pregnancy, unspecified trimester: Secondary | ICD-10-CM

## 2017-08-31 DIAGNOSIS — O99820 Streptococcus B carrier state complicating pregnancy: Secondary | ICD-10-CM

## 2017-08-31 DIAGNOSIS — Z3480 Encounter for supervision of other normal pregnancy, unspecified trimester: Secondary | ICD-10-CM

## 2017-08-31 DIAGNOSIS — Z3689 Encounter for other specified antenatal screening: Secondary | ICD-10-CM

## 2017-08-31 DIAGNOSIS — O99330 Smoking (tobacco) complicating pregnancy, unspecified trimester: Secondary | ICD-10-CM

## 2017-08-31 DIAGNOSIS — O368190 Decreased fetal movements, unspecified trimester, not applicable or unspecified: Secondary | ICD-10-CM | POA: Insufficient documentation

## 2017-08-31 DIAGNOSIS — O368130 Decreased fetal movements, third trimester, not applicable or unspecified: Secondary | ICD-10-CM

## 2017-08-31 HISTORY — DX: Decreased fetal movements, unspecified trimester, not applicable or unspecified: O36.8190

## 2017-08-31 NOTE — Discharge Instructions (Signed)
Vaginal Delivery Vaginal delivery means that you will give birth by pushing your baby out of your birth canal (vagina). A team of health care providers will help you before, during, and after vaginal delivery. Birth experiences are unique for every woman and every pregnancy, and birth experiences vary depending on where you choose to give birth. What should I do to prepare for my baby's birth? Before your baby is born, it is important to talk with your health care provider about:  Your labor and delivery preferences. These may include: ? Medicines that you may be given. ? How you will manage your pain. This might include non-medical pain relief techniques or injectable pain relief such as epidural analgesia. ? How you and your baby will be monitored during labor and delivery. ? Who may be in the labor and delivery room with you. ? Your feelings about surgical delivery of your baby (cesarean delivery, or C-section) if this becomes necessary. ? Your feelings about receiving donated blood through an IV tube (blood transfusion) if this becomes necessary.  Whether you are able: ? To take pictures or videos of the birth. ? To eat during labor and delivery. ? To move around, walk, or change positions during labor and delivery.  What to expect after your baby is born, such as: ? Whether delayed umbilical cord clamping and cutting is offered. ? Who will care for your baby right after birth. ? Medicines or tests that may be recommended for your baby. ? Whether breastfeeding is supported in your hospital or birth center. ? How long you will be in the hospital or birth center.  How any medical conditions you have may affect your baby or your labor and delivery experience.  To prepare for your baby's birth, you should also:  Attend all of your health care visits before delivery (prenatal visits) as recommended by your health care provider. This is important.  Prepare your home for your baby's  arrival. Make sure that you have: ? Diapers. ? Baby clothing. ? Feeding equipment. ? Safe sleeping arrangements for you and your baby.  Install a car seat in your vehicle. Have your car seat checked by a certified car seat installer to make sure that it is installed safely.  Think about who will help you with your new baby at home for at least the first several weeks after delivery.  What can I expect when I arrive at the birth center or hospital? Once you are in labor and have been admitted into the hospital or birth center, your health care provider may:  Review your pregnancy history and any concerns you have.  Insert an IV tube into one of your veins. This is used to give you fluids and medicines.  Check your blood pressure, pulse, temperature, and heart rate (vital signs).  Check whether your bag of water (amniotic sac) has broken (ruptured).  Talk with you about your birth plan and discuss pain control options.  Monitoring Your health care provider may monitor your contractions (uterine monitoring) and your baby's heart rate (fetal monitoring). You may need to be monitored:  Often, but not continuously (intermittently).  All the time or for long periods at a time (continuously). Continuous monitoring may be needed if: ? You are taking certain medicines, such as medicine to relieve pain or make your contractions stronger. ? You have pregnancy or labor complications.  Monitoring may be done by:  Placing a special stethoscope or a handheld monitoring device on your abdomen to   check your baby's heartbeat, and feeling your abdomen for contractions. This method of monitoring does not continuously record your baby's heartbeat or your contractions.  Placing monitors on your abdomen (external monitors) to record your baby's heartbeat and the frequency and length of contractions. You may not have to wear external monitors all the time.  Placing monitors inside of your uterus  (internal monitors) to record your baby's heartbeat and the frequency, length, and strength of your contractions. ? Your health care provider may use internal monitors if he or she needs more information about the strength of your contractions or your baby's heart rate. ? Internal monitors are put in place by passing a thin, flexible wire through your vagina and into your uterus. Depending on the type of monitor, it may remain in your uterus or on your baby's head until birth. ? Your health care provider will discuss the benefits and risks of internal monitoring with you and will ask for your permission before inserting the monitors.  Telemetry. This is a type of continuous monitoring that can be done with external or internal monitors. Instead of having to stay in bed, you are able to move around during telemetry. Ask your health care provider if telemetry is an option for you.  Physical exam Your health care provider may perform a physical exam. This may include:  Checking whether your baby is positioned: ? With the head toward your vagina (head-down). This is most common. ? With the head toward the top of your uterus (head-up or breech). If your baby is in a breech position, your health care provider may try to turn your baby to a head-down position so you can deliver vaginally. If it does not seem that your baby can be born vaginally, your provider may recommend surgery to deliver your baby. In rare cases, you may be able to deliver vaginally if your baby is head-up (breech delivery). ? Lying sideways (transverse). Babies that are lying sideways cannot be delivered vaginally.  Checking your cervix to determine: ? Whether it is thinning out (effacing). ? Whether it is opening up (dilating). ? How low your baby has moved into your birth canal.  What are the three stages of labor and delivery?  Normal labor and delivery is divided into the following three stages: Stage 1  Stage 1 is the  longest stage of labor, and it can last for hours or days. Stage 1 includes: ? Early labor. This is when contractions may be irregular, or regular and mild. Generally, early labor contractions are more than 10 minutes apart. ? Active labor. This is when contractions get longer, more regular, more frequent, and more intense. ? The transition phase. This is when contractions happen very close together, are very intense, and may last longer than during any other part of labor.  Contractions generally feel mild, infrequent, and irregular at first. They get stronger, more frequent (about every 2-3 minutes), and more regular as you progress from early labor through active labor and transition.  Many women progress through stage 1 naturally, but you may need help to continue making progress. If this happens, your health care provider may talk with you about: ? Rupturing your amniotic sac if it has not ruptured yet. ? Giving you medicine to help make your contractions stronger and more frequent.  Stage 1 ends when your cervix is completely dilated to 4 inches (10 cm) and completely effaced. This happens at the end of the transition phase. Stage 2  Once   your cervix is completely effaced and dilated to 4 inches (10 cm), you may start to feel an urge to push. It is common for the body to naturally take a rest before feeling the urge to push, especially if you received an epidural or certain other pain medicines. This rest period may last for up to 1-2 hours, depending on your unique labor experience.  During stage 2, contractions are generally less painful, because pushing helps relieve contraction pain. Instead of contraction pain, you may feel stretching and burning pain, especially when the widest part of your baby's head passes through the vaginal opening (crowning).  Your health care provider will closely monitor your pushing progress and your baby's progress through the vagina during stage 2.  Your  health care provider may massage the area of skin between your vaginal opening and anus (perineum) or apply warm compresses to your perineum. This helps it stretch as the baby's head starts to crown, which can help prevent perineal tearing. ? In some cases, an incision may be made in your perineum (episiotomy) to allow the baby to pass through the vaginal opening. An episiotomy helps to make the opening of the vagina larger to allow more room for the baby to fit through.  It is very important to breathe and focus so your health care provider can control the delivery of your baby's head. Your health care provider may have you decrease the intensity of your pushing, to help prevent perineal tearing.  After delivery of your baby's head, the shoulders and the rest of the body generally deliver very quickly and without difficulty.  Once your baby is delivered, the umbilical cord may be cut right away, or this may be delayed for 1-2 minutes, depending on your baby's health. This may vary among health care providers, hospitals, and birth centers.  If you and your baby are healthy enough, your baby may be placed on your chest or abdomen to help maintain the baby's temperature and to help you bond with each other. Some mothers and babies start breastfeeding at this time. Your health care team will dry your baby and help keep your baby warm during this time.  Your baby may need immediate care if he or she: ? Showed signs of distress during labor. ? Has a medical condition. ? Was born too early (prematurely). ? Had a bowel movement before birth (meconium). ? Shows signs of difficulty transitioning from being inside the uterus to being outside of the uterus. If you are planning to breastfeed, your health care team will help you begin a feeding. Stage 3  The third stage of labor starts immediately after the birth of your baby and ends after you deliver the placenta. The placenta is an organ that develops  during pregnancy to provide oxygen and nutrients to your baby in the womb.  Delivering the placenta may require some pushing, and you may have mild contractions. Breastfeeding can stimulate contractions to help you deliver the placenta.  After the placenta is delivered, your uterus should tighten (contract) and become firm. This helps to stop bleeding in your uterus. To help your uterus contract and to control bleeding, your health care provider may: ? Give you medicine by injection, through an IV tube, by mouth, or through your rectum (rectally). ? Massage your abdomen or perform a vaginal exam to remove any blood clots that are left in your uterus. ? Empty your bladder by placing a thin, flexible tube (catheter) into your bladder. ? Encourage   you to breastfeed your baby. After labor is over, you and your baby will be monitored closely to ensure that you are both healthy until you are ready to go home. Your health care team will teach you how to care for yourself and your baby. This information is not intended to replace advice given to you by your health care provider. Make sure you discuss any questions you have with your health care provider. Document Released: 10/14/2007 Document Revised: 07/25/2015 Document Reviewed: 01/19/2015 Elsevier Interactive Patient Education  2018 [employer]  

## 2017-08-31 NOTE — Progress Notes (Signed)
Subjective:  Cassidy Meyer is a 22 y.o. G2P0010 at 4062w6d being seen today for ongoing prenatal care.  She is currently monitored for the following issues for this high-risk pregnancy and has Hearing loss; ECZEMA; Depression; Migraine; Supervision of other normal pregnancy, antepartum; Asthma affecting pregnancy, antepartum; Tobacco smoking affecting pregnancy, antepartum; Group B streptococcal carriage complicating pregnancy; and Decreased fetal movement on their problem list.  Patient reports decreased FM. Vaginal discharge/?LOF. Has been seen in MAU x 2 for ?LOF, W/U negative.  Contractions: Not present. Vag. Bleeding: None.  Movement: (!) Decreased. Denies leaking of fluid.   The following portions of the patient's history were reviewed and updated as appropriate: allergies, current medications, past family history, past medical history, past social history, past surgical history and problem list. Problem list updated.  Objective:   Vitals:   08/31/17 0955  BP: 133/86  Pulse: (!) 102  Weight: 208 lb 12.8 oz (94.7 kg)    Fetal Status:     Movement: (!) Decreased     General:  Alert, oriented and cooperative. Patient is in no acute distress.  Skin: Skin is warm and dry. No rash noted.   Cardiovascular: Normal heart rate noted  Respiratory: Normal respiratory effort, no problems with respiration noted  Abdomen: Soft, gravid, appropriate for gestational age. Pain/Pressure: Absent     Pelvic:  Cervical exam deferred        Extremities: Normal range of motion.  Edema: None  Mental Status: Normal mood and affect. Normal behavior. Normal judgment and thought content.   Urinalysis:      Assessment and Plan:  Pregnancy: G2P0010 at 5662w6d  1. Supervision of other normal pregnancy, antepartum Stable Labor precautions Pt request U/S, reviewed with pt medical indication needed for U/S  2. Group B streptococcal carriage complicating pregnancy Tx while in labor  3. Decreased fetal  movements in third trimester, single or unspecified fetus NST not reactive, negative CST however Will send to MAU for additional monitoring +/- BPP   Term labor symptoms and general obstetric precautions including but not limited to vaginal bleeding, contractions, leaking of fluid and fetal movement were reviewed in detail with the patient. Please refer to After Visit Summary for other counseling recommendations.  Return in about 1 week (around 09/07/2017) for OB visit.   Hermina StaggersErvin, Michael L, MD

## 2017-08-31 NOTE — MAU Note (Signed)
Evaluated at clinic for ? Leaking.  Tracing non-reactive.  No bleeding or pain.

## 2017-08-31 NOTE — MAU Provider Note (Addendum)
History     CSN: [id number]  Arrival date and time: 08/31/17 1052   First Provider Initiated Contact with Patient 08/31/17 1118      Chief Complaint  Patient presents with  . non-reactive fetal tracing   HPI Cassidy Meyer is a 22 y.o. G2P0010 African-American female at 6321w6d with uncomplicated pregnancy sent from clinic for NRNST this morning at 1000. Patient denies CTX, abdominal pain, vaginal bleeding, or leaking of fluid. She denies HA, SOB, or peripheral edema.   OB History    Gravida  2   Para  0   Term      Preterm      AB  1   Living        SAB      TAB  1   Ectopic      Multiple  0   Live Births              Past Medical History:  Diagnosis Date  . Asthma   . Bronchitis   . Headache     Past Surgical History:  Procedure Laterality Date  . APPENDECTOMY    . HERNIA REPAIR      Family History  Problem Relation Age of Onset  . Hypertension Mother   . Cancer Maternal Aunt        bladder  . Dementia Maternal Grandmother   . Diabetes Neg Hx     Social History   Tobacco Use  . Smoking status: Former Smoker    Packs/day: 0.25    Years: 7.00    Pack years: 1.75    Types: Cigarettes  . Smokeless tobacco: Never Used  Substance Use Topics  . Alcohol use: No  . Drug use: No    Allergies: No Known Allergies  Medications Prior to Admission  Medication Sig Dispense Refill Last Dose  . Elastic Bandages & Supports (COMFORT FIT MATERNITY SUPP MED) MISC Wear daily when ambulating 1 each 0 Taking  . Prenatal Multivit-Min-Fe-FA (PRENATAL VITAMINS PO) Take 1 tablet by mouth daily.    Taking    Review of Systems  Constitutional: Negative for activity change, fever and unexpected weight change.  Eyes: Negative for visual disturbance.  Respiratory: Negative for chest tightness and shortness of breath.   Cardiovascular: Negative for chest pain, palpitations and leg swelling.  Gastrointestinal: Negative for abdominal distention, abdominal  pain, diarrhea, nausea and vomiting.  Genitourinary: Negative for difficulty urinating, dysuria, pelvic pain, vaginal bleeding and vaginal discharge.  Neurological: Negative for dizziness and headaches.   Physical Exam   Blood pressure 118/70, pulse 94, temperature 99 F (37.2 C), temperature source Oral, resp. rate 18, last menstrual period 12/15/2016.  Physical Exam  Constitutional: She is oriented to person, place, and time. She appears well-developed and well-nourished. No distress.  HENT:  Head: Normocephalic and atraumatic.  Cardiovascular: Normal rate and regular rhythm.  Respiratory: Effort normal. No respiratory distress.  GI: Soft. Bowel sounds are normal. She exhibits no distension and no mass. There is no tenderness. There is no rebound and no guarding.  Musculoskeletal: Normal range of motion. She exhibits no edema.  Neurological: She is alert and oriented to person, place, and time.  Skin: Skin is warm and dry.  Psychiatric: She has a normal mood and affect. Her behavior is normal.    MAU Course   MDM 140 bpm/moderate variability/2 acels over 20 minute period, no decels No complaints Irregular contractions on toco, but patient does not feel them  Patient Vitals  for the past 24 hrs:  BP Temp Temp src Pulse Resp  08/31/17 1126 118/70 - - 94 -  08/31/17 1117 (!) 146/87 99 F (37.2 C) Oral 99 18    Assessment and Plan  1. Reactive fetal tracing (Category I)  - Reviewed signs of labor and warning signs for which to return to MAU  Cassidy Meyer 08/31/2017, 11:39 AM   STUDENT ADDENDUM I have separately seen and examined the patient. I have discussed the findings and exam with the medical student and agree with the above note. I helped develop the management plan that is described in the student's note, and I agree with the content.  Reactive NST obtained in left lateral position with patient consuming 8 fl oz apple juice in MAU. Active fetal movement throughout  observation in MAU  Clayton BiblesSamantha Weinhold, [place] 08/31/17  11:47 AM

## 2017-08-31 NOTE — Addendum Note (Signed)
Addended by: Hamilton CapriBURCH, ARIEL J on: 08/31/2017 10:53 AM   Modules accepted: Orders

## 2017-08-31 NOTE — Patient Instructions (Signed)
Vaginal Delivery Vaginal delivery means that you will give birth by pushing your baby out of your birth canal (vagina). A team of health care providers will help you before, during, and after vaginal delivery. Birth experiences are unique for every woman and every pregnancy, and birth experiences vary depending on where you choose to give birth. What should I do to prepare for my baby's birth? Before your baby is born, it is important to talk with your health care provider about:  Your labor and delivery preferences. These may include: ? Medicines that you may be given. ? How you will manage your pain. This might include non-medical pain relief techniques or injectable pain relief such as epidural analgesia. ? How you and your baby will be monitored during labor and delivery. ? Who may be in the labor and delivery room with you. ? Your feelings about surgical delivery of your baby (cesarean delivery, or C-section) if this becomes necessary. ? Your feelings about receiving donated blood through an IV tube (blood transfusion) if this becomes necessary.  Whether you are able: ? To take pictures or videos of the birth. ? To eat during labor and delivery. ? To move around, walk, or change positions during labor and delivery.  What to expect after your baby is born, such as: ? Whether delayed umbilical cord clamping and cutting is offered. ? Who will care for your baby right after birth. ? Medicines or tests that may be recommended for your baby. ? Whether breastfeeding is supported in your hospital or birth center. ? How long you will be in the hospital or birth center.  How any medical conditions you have may affect your baby or your labor and delivery experience.  To prepare for your baby's birth, you should also:  Attend all of your health care visits before delivery (prenatal visits) as recommended by your health care provider. This is important.  Prepare your home for your baby's  arrival. Make sure that you have: ? Diapers. ? Baby clothing. ? Feeding equipment. ? Safe sleeping arrangements for you and your baby.  Install a car seat in your vehicle. Have your car seat checked by a certified car seat installer to make sure that it is installed safely.  Think about who will help you with your new baby at home for at least the first several weeks after delivery.  What can I expect when I arrive at the birth center or hospital? Once you are in labor and have been admitted into the hospital or birth center, your health care provider may:  Review your pregnancy history and any concerns you have.  Insert an IV tube into one of your veins. This is used to give you fluids and medicines.  Check your blood pressure, pulse, temperature, and heart rate (vital signs).  Check whether your bag of water (amniotic sac) has broken (ruptured).  Talk with you about your birth plan and discuss pain control options.  Monitoring Your health care provider may monitor your contractions (uterine monitoring) and your baby's heart rate (fetal monitoring). You may need to be monitored:  Often, but not continuously (intermittently).  All the time or for long periods at a time (continuously). Continuous monitoring may be needed if: ? You are taking certain medicines, such as medicine to relieve pain or make your contractions stronger. ? You have pregnancy or labor complications.  Monitoring may be done by:  Placing a special stethoscope or a handheld monitoring device on your abdomen to   check your baby's heartbeat, and feeling your abdomen for contractions. This method of monitoring does not continuously record your baby's heartbeat or your contractions.  Placing monitors on your abdomen (external monitors) to record your baby's heartbeat and the frequency and length of contractions. You may not have to wear external monitors all the time.  Placing monitors inside of your uterus  (internal monitors) to record your baby's heartbeat and the frequency, length, and strength of your contractions. ? Your health care provider may use internal monitors if he or she needs more information about the strength of your contractions or your baby's heart rate. ? Internal monitors are put in place by passing a thin, flexible wire through your vagina and into your uterus. Depending on the type of monitor, it may remain in your uterus or on your baby's head until birth. ? Your health care provider will discuss the benefits and risks of internal monitoring with you and will ask for your permission before inserting the monitors.  Telemetry. This is a type of continuous monitoring that can be done with external or internal monitors. Instead of having to stay in bed, you are able to move around during telemetry. Ask your health care provider if telemetry is an option for you.  Physical exam Your health care provider may perform a physical exam. This may include:  Checking whether your baby is positioned: ? With the head toward your vagina (head-down). This is most common. ? With the head toward the top of your uterus (head-up or breech). If your baby is in a breech position, your health care provider may try to turn your baby to a head-down position so you can deliver vaginally. If it does not seem that your baby can be born vaginally, your provider may recommend surgery to deliver your baby. In rare cases, you may be able to deliver vaginally if your baby is head-up (breech delivery). ? Lying sideways (transverse). Babies that are lying sideways cannot be delivered vaginally.  Checking your cervix to determine: ? Whether it is thinning out (effacing). ? Whether it is opening up (dilating). ? How low your baby has moved into your birth canal.  What are the three stages of labor and delivery?  Normal labor and delivery is divided into the following three stages: Stage 1  Stage 1 is the  longest stage of labor, and it can last for hours or days. Stage 1 includes: ? Early labor. This is when contractions may be irregular, or regular and mild. Generally, early labor contractions are more than 10 minutes apart. ? Active labor. This is when contractions get longer, more regular, more frequent, and more intense. ? The transition phase. This is when contractions happen very close together, are very intense, and may last longer than during any other part of labor.  Contractions generally feel mild, infrequent, and irregular at first. They get stronger, more frequent (about every 2-3 minutes), and more regular as you progress from early labor through active labor and transition.  Many women progress through stage 1 naturally, but you may need help to continue making progress. If this happens, your health care provider may talk with you about: ? Rupturing your amniotic sac if it has not ruptured yet. ? Giving you medicine to help make your contractions stronger and more frequent.  Stage 1 ends when your cervix is completely dilated to 4 inches (10 cm) and completely effaced. This happens at the end of the transition phase. Stage 2  Once   your cervix is completely effaced and dilated to 4 inches (10 cm), you may start to feel an urge to push. It is common for the body to naturally take a rest before feeling the urge to push, especially if you received an epidural or certain other pain medicines. This rest period may last for up to 1-2 hours, depending on your unique labor experience.  During stage 2, contractions are generally less painful, because pushing helps relieve contraction pain. Instead of contraction pain, you may feel stretching and burning pain, especially when the widest part of your baby's head passes through the vaginal opening (crowning).  Your health care provider will closely monitor your pushing progress and your baby's progress through the vagina during stage 2.  Your  health care provider may massage the area of skin between your vaginal opening and anus (perineum) or apply warm compresses to your perineum. This helps it stretch as the baby's head starts to crown, which can help prevent perineal tearing. ? In some cases, an incision may be made in your perineum (episiotomy) to allow the baby to pass through the vaginal opening. An episiotomy helps to make the opening of the vagina larger to allow more room for the baby to fit through.  It is very important to breathe and focus so your health care provider can control the delivery of your baby's head. Your health care provider may have you decrease the intensity of your pushing, to help prevent perineal tearing.  After delivery of your baby's head, the shoulders and the rest of the body generally deliver very quickly and without difficulty.  Once your baby is delivered, the umbilical cord may be cut right away, or this may be delayed for 1-2 minutes, depending on your baby's health. This may vary among health care providers, hospitals, and birth centers.  If you and your baby are healthy enough, your baby may be placed on your chest or abdomen to help maintain the baby's temperature and to help you bond with each other. Some mothers and babies start breastfeeding at this time. Your health care team will dry your baby and help keep your baby warm during this time.  Your baby may need immediate care if he or she: ? Showed signs of distress during labor. ? Has a medical condition. ? Was born too early (prematurely). ? Had a bowel movement before birth (meconium). ? Shows signs of difficulty transitioning from being inside the uterus to being outside of the uterus. If you are planning to breastfeed, your health care team will help you begin a feeding. Stage 3  The third stage of labor starts immediately after the birth of your baby and ends after you deliver the placenta. The placenta is an organ that develops  during pregnancy to provide oxygen and nutrients to your baby in the womb.  Delivering the placenta may require some pushing, and you may have mild contractions. Breastfeeding can stimulate contractions to help you deliver the placenta.  After the placenta is delivered, your uterus should tighten (contract) and become firm. This helps to stop bleeding in your uterus. To help your uterus contract and to control bleeding, your health care provider may: ? Give you medicine by injection, through an IV tube, by mouth, or through your rectum (rectally). ? Massage your abdomen or perform a vaginal exam to remove any blood clots that are left in your uterus. ? Empty your bladder by placing a thin, flexible tube (catheter) into your bladder. ? Encourage   you to breastfeed your baby. After labor is over, you and your baby will be monitored closely to ensure that you are both healthy until you are ready to go home. Your health care team will teach you how to care for yourself and your baby. This information is not intended to replace advice given to you by your health care provider. Make sure you discuss any questions you have with your health care provider. Document Released: 10/14/2007 Document Revised: 07/25/2015 Document Reviewed: 01/19/2015 Elsevier Interactive Patient Education  2018 [employer]  

## 2017-08-31 NOTE — Progress Notes (Signed)
Pt reports decreased fetal movement. She states that fetal movement decreased around this pat Sunday.

## 2017-09-01 ENCOUNTER — Encounter (HOSPITAL_COMMUNITY): Payer: Self-pay | Admitting: *Deleted

## 2017-09-01 ENCOUNTER — Inpatient Hospital Stay (HOSPITAL_COMMUNITY)
Admission: AD | Admit: 2017-09-01 | Discharge: 2017-09-04 | DRG: 807 | Disposition: A | Discharge status: Home / Self Care | Payer: Managed Care, Other (non HMO) | Attending: Obstetrics and Gynecology | Admitting: Obstetrics and Gynecology

## 2017-09-01 ENCOUNTER — Telehealth: Payer: Self-pay

## 2017-09-01 ENCOUNTER — Other Ambulatory Visit: Payer: Self-pay

## 2017-09-01 ENCOUNTER — Encounter: Payer: Managed Care, Other (non HMO) | Admitting: Obstetrics and Gynecology

## 2017-09-01 DIAGNOSIS — O9902 Anemia complicating childbirth: Secondary | ICD-10-CM | POA: Diagnosis present

## 2017-09-01 DIAGNOSIS — O368130 Decreased fetal movements, third trimester, not applicable or unspecified: Secondary | ICD-10-CM | POA: Diagnosis present

## 2017-09-01 DIAGNOSIS — E669 Obesity, unspecified: Secondary | ICD-10-CM | POA: Diagnosis present

## 2017-09-01 DIAGNOSIS — Z87891 Personal history of nicotine dependence: Secondary | ICD-10-CM

## 2017-09-01 DIAGNOSIS — D649 Anemia, unspecified: Secondary | ICD-10-CM | POA: Diagnosis present

## 2017-09-01 DIAGNOSIS — O4292 Full-term premature rupture of membranes, unspecified as to length of time between rupture and onset of labor: Secondary | ICD-10-CM | POA: Diagnosis present

## 2017-09-01 DIAGNOSIS — O99214 Obesity complicating childbirth: Secondary | ICD-10-CM | POA: Diagnosis present

## 2017-09-01 DIAGNOSIS — O99824 Streptococcus B carrier state complicating childbirth: Secondary | ICD-10-CM | POA: Diagnosis present

## 2017-09-01 DIAGNOSIS — O4202 Full-term premature rupture of membranes, onset of labor within 24 hours of rupture: Secondary | ICD-10-CM | POA: Diagnosis not present

## 2017-09-01 DIAGNOSIS — Z3A38 38 weeks gestation of pregnancy: Secondary | ICD-10-CM

## 2017-09-01 LAB — TYPE AND SCREEN
ABO/RH(D): B POS
Antibody Screen: NEGATIVE

## 2017-09-01 LAB — CBC
HCT: 36.6 % (ref 36.0–46.0)
Hemoglobin: 11.8 g/dL — ABNORMAL LOW (ref 12.0–15.0)
MCH: 26.7 pg (ref 26.0–34.0)
MCHC: 32.2 g/dL (ref 30.0–36.0)
MCV: 82.8 fL (ref 78.0–100.0)
Platelets: 148 10*3/uL — ABNORMAL LOW (ref 150–400)
RBC: 4.42 MIL/uL (ref 3.87–5.11)
RDW: 13.5 % (ref 11.5–15.5)
WBC: 8.4 10*3/uL (ref 4.0–10.5)

## 2017-09-01 LAB — AMNISURE RUPTURE OF MEMBRANE (ROM) NOT AT ARMC: Amnisure ROM: POSITIVE

## 2017-09-01 MED ORDER — OXYTOCIN BOLUS FROM INFUSION
500.0000 mL | Freq: Once | INTRAVENOUS | Status: AC
  Administered 2017-09-02: 500 mL via INTRAVENOUS

## 2017-09-01 MED ORDER — FLEET ENEMA 7-19 GM/118ML RE ENEM: 1.0000 | ENEMA | RECTAL | Status: DC | PRN

## 2017-09-01 MED ORDER — FENTANYL CITRATE (PF) 100 MCG/2ML IJ SOLN
50.0000 ug | INTRAMUSCULAR | Status: DC | PRN
  Administered 2017-09-02 (×3): 100 ug via INTRAVENOUS
  Filled 2017-09-01 (×3): qty 2

## 2017-09-01 MED ORDER — OXYCODONE-ACETAMINOPHEN 5-325 MG PO TABS: 2.0000 | ORAL_TABLET | ORAL | Status: DC | PRN

## 2017-09-01 MED ORDER — LACTATED RINGERS IV SOLN: 500.0000 mL | INTRAVENOUS | Status: DC | PRN

## 2017-09-01 MED ORDER — OXYCODONE-ACETAMINOPHEN 5-325 MG PO TABS: 1.0000 | ORAL_TABLET | ORAL | Status: DC | PRN

## 2017-09-01 MED ORDER — ONDANSETRON HCL 4 MG/2ML IJ SOLN: 4.0000 mg | Freq: Four times a day (QID) | INTRAMUSCULAR | Status: DC | PRN

## 2017-09-01 MED ORDER — SOD CITRATE-CITRIC ACID 500-334 MG/5ML PO SOLN: 30.0000 mL | ORAL | Status: DC | PRN

## 2017-09-01 MED ORDER — HYDROXYZINE HCL 50 MG PO TABS
50.0000 mg | ORAL_TABLET | Freq: Four times a day (QID) | ORAL | Status: DC | PRN
  Administered 2017-09-02: 50 mg via ORAL
  Filled 2017-09-01 (×2): qty 1

## 2017-09-01 MED ORDER — OXYTOCIN 40 UNITS IN LACTATED RINGERS INFUSION - SIMPLE MED: 2.5000 [IU]/h | INTRAVENOUS | Status: DC

## 2017-09-01 MED ORDER — LACTATED RINGERS IV SOLN
INTRAVENOUS | Status: DC
  Administered 2017-09-01 – 2017-09-02 (×2): via INTRAVENOUS

## 2017-09-01 MED ORDER — PENICILLIN G 3 MILLION UNITS IVPB - SIMPLE MED
3.0000 10*6.[IU] | INTRAVENOUS | Status: DC
  Administered 2017-09-02 (×3): 3 10*6.[IU] via INTRAVENOUS
  Filled 2017-09-01 (×2): qty 3
  Filled 2017-09-01 (×3): qty 100
  Filled 2017-09-01: qty 3
  Filled 2017-09-01: qty 100

## 2017-09-01 MED ORDER — LIDOCAINE HCL (PF) 1 % IJ SOLN
30.0000 mL | INTRAMUSCULAR | Status: DC | PRN
  Filled 2017-09-01: qty 30

## 2017-09-01 MED ORDER — ACETAMINOPHEN 325 MG PO TABS: 650.0000 mg | ORAL_TABLET | ORAL | Status: DC | PRN

## 2017-09-01 MED ORDER — SODIUM CHLORIDE 0.9 % IV SOLN
5.0000 10*6.[IU] | Freq: Once | INTRAVENOUS | Status: AC
  Administered 2017-09-01: 5 10*6.[IU] via INTRAVENOUS
  Filled 2017-09-01: qty 5

## 2017-09-01 NOTE — H&P (Addendum)
LABOR AND DELIVERY ADMISSION HISTORY AND PHYSICAL NOTE  Cassidy Meyer is a 22 y.o. female G2P0010 with IUP at 73w0dby u/s presenting for PROM at approximately 1600.  She reports positive fetal movement. She endorses leakage of clear fluid. Denies vaginal bleeding. Denies feeling contractions. She is having a girl and plans on breast feeding. She is undecided on method of contraception and is "not a fan of birth control."  [facility]:  - Asthma - Tobacco use, patient reports she stopped second month of pregnancy - GBS positive  Past Medical History: Past Medical History:  Diagnosis Date  . Asthma   . Bronchitis   . Headache     Past Surgical History: Past Surgical History:  Procedure Laterality Date  . APPENDECTOMY    . HERNIA REPAIR      Obstetrical History: OB History    Gravida  2   Para  0   Term      Preterm      AB  1   Living        SAB      TAB  1   Ectopic      Multiple  0   Live Births              Social History: Social History   Socioeconomic History  . Marital status: Single    Spouse name: Not on file  . Number of children: Not on file  . Years of education: Not on file  . Highest education level: Not on file  Occupational History  . Occupation: [employer]  Social Needs  . Financial resource strain: Not on file  . Food insecurity:    Worry: Never true    Inability: Never true  . Transportation needs:    Medical: No    Non-medical: No  Tobacco Use  . Smoking status: Former Smoker    Packs/day: 0.25    Years: 7.00    Pack years: 1.75    Types: Cigarettes  . Smokeless tobacco: Never Used  Substance and Sexual Activity  . Alcohol use: No  . Drug use: No  . Sexual activity: Yes    Birth control/protection: None  Lifestyle  . Physical activity:    Days per week: 0 days    Minutes per session: 0 min  . Stress: Not at all  Relationships  .  Social connections:    Talks on phone: More than three times a week    Gets together: Once a week    Attends religious service: 1 to 4 times per year    Active member of club or organization: No    Attends meetings of clubs or organizations: Never    Relationship status: Living with partner  Other Topics Concern  . Not on file  Social History Narrative  . Not on file    Family History: Family History  Problem Relation Age of Onset  . Hypertension Mother   . Cancer Maternal Aunt        bladder  . Dementia Maternal Grandmother   . Diabetes Neg Hx     Allergies: No Known Allergies  Medications Prior to Admission  Medication Sig Dispense Refill Last Dose  . Prenatal Multivit-Min-Fe-FA (PRENATAL VITAMINS PO) Take 1 tablet by mouth daily.    08/31/2017 at Unknown time  . Elastic Bandages & Supports (COMFORT FIT MATERNITY SUPP MED) MISC Wear daily when ambulating 1 each 0 Taking  Review of Systems  All systems reviewed and negative except as stated in HPI  Physical Exam Blood pressure 137/82, pulse (!) 101, temperature (!) 97.5 F (36.4 C), temperature source Oral, resp. rate 17, height '5\' 3"'  (1.6 m), weight 95.7 kg, last menstrual period 12/15/2016, SpO2 99 %. General appearance: alert, oriented, NAD Lungs: normal respiratory effort Heart: regular rate Abdomen: soft, non-tender; gravid, FH appropriate for GA Extremities: No calf swelling or tenderness Presentation: cephalic by exam Fetal monitoring: Cat I Uterine activity: Irregular uterine contractions    Prenatal labs: ABO, Rh: --/--/B POS (08/15 2110) Antibody: NEG (08/15 2110) Rubella: 4.23 (01/28 1133) RPR: Non Reactive (06/06 0905)  HBsAg: Negative (01/28 1133)  HIV: Non Reactive (06/06 0905)  GC/Chlamydia: Nrgative (06/25) GBS: Positive (07/30 1701)  2-hr GTT: Passed (06/06) Genetic screening: n/a Anatomy US: Normal (EFW 61%)  Prenatal Transfer Tool  Maternal Diabetes: No Genetic Screening:  Declined Maternal Ultrasounds/Referrals: Normal Fetal Ultrasounds or other Referrals:  None Maternal Substance Abuse:  Yes:  Type: Smoker, patient reports she stopped 2nd month of pregnancy  Significant Maternal Medications:  None Significant Maternal Lab Results: Lab values include: Group B Strep positive  Results for orders placed or performed during the hospital encounter of 09/01/17 (from the past 24 hour(s))  Amnisure rupture of membrane (rom)not at [facility] Time: 09/01/17  8:06 PM  Result Value Ref Range   Amnisure ROM POSITIVE   Type and screen [place]   Collection Time: 09/01/17  9:10 PM  Result Value Ref Range   ABO/RH(D) B POS    Antibody Screen NEG    Sample Expiration      09/04/2017 Performed at [facility], [address], [place],  [address]   CBC   Collection Time: 09/01/17  9:25 PM  Result Value Ref Range   WBC 8.4 4.0 - 10.5 K/uL   RBC 4.42 3.87 - 5.11 MIL/uL   Hemoglobin 11.8 (L) 12.0 - 15.0 g/dL   HCT 36.6 36.0 - 46.0 %   MCV 82.8 78.0 - 100.0 fL   MCH 26.7 26.0 - 34.0 pg   MCHC 32.2 30.0 - 36.0 g/dL   RDW 13.5 11.5 - 15.5 %   Platelets 148 (L) 150 - 400 K/uL    Patient Active Problem List   Diagnosis Date Noted  . Normal labor 09/01/2017  . Decreased fetal movement 08/31/2017  . Group B streptococcal carriage complicating pregnancy 41/96/2229  . Supervision of other normal pregnancy, antepartum 02/14/2017  . Asthma affecting pregnancy, antepartum 02/14/2017  . Tobacco smoking affecting pregnancy, antepartum 02/14/2017  . Migraine 11/14/2015  . Depression 04/21/2014  . Hearing loss 04/22/2009  . ECZEMA 01/06/2009    Assessment: Cassidy Meyer is a 22 y.o. G2P0010 at 30w0dhere for PROM around 1600 today.   #Labor: FB #Pain: Epidural if patient requests #FWB: Cat I #ID: Penicillin for GBS Pos #MOF: Breast  #MOC: Undecided  #Circ: n/a  BIsabel Caprice8/15/2019, 11:51 PM   I have spoken with  and examined this patient and agree with resident/PA-S/Med-S/SNM's note and plan of care. VSS, HRR&R, Resp unlabored, Legs neg.  FNigel Berthold CNM 09/02/2017 6:45 AM

## 2017-09-01 NOTE — MAU Note (Signed)
PT SAYS SHE CAME Sunday BC HAD LEAKING  AT 11AM -  THEN D/C - NOT ROM.   THEN CAME BACK ON Monday X2  BC HAD BEEN LEAKING  - THEN D/C HOME- NOT  ROM.  YESTERDAY WENT TO OFFICE-  TOLD OF LEAKING FLUID- GAVE NST-  SENT HERE.  HERE NST WAS REACTIVE- SO D/C HOME   AND NOW BACK TODAY SAYS  HAD GUSH OF FLUID  THROUGH DAY -  SOAKED 4 PADS . FEELS SOME MILD UC'S  .

## 2017-09-01 NOTE — Telephone Encounter (Signed)
TC from pt reporting  she is leaking pt states her underwear is saturated. consulted w/ Dr.Ervin in the office Pt advised to go back to MAU Pt voiced understanding.

## 2017-09-01 NOTE — Anesthesia Pain Management Evaluation Note (Signed)
  CRNA Pain Management Visit Note  Patient: Cassidy Meyer, 22 y.o., female  "Hello [facility] am a [facility]. We have an anesthesia team available at all times to provide care throughout the hospital, including epidural management and anesthesia for C-section. I don't know your plan for the delivery whether it a natural birth, water birth, IV sedation, nitrous supplementation, doula or epidural, but we want to meet your pain goals."   1.Was your pain managed to your expectations on prior hospitalizations?   No prior hospitalizations  2.What is your expectation for pain management during this hospitalization?     Epidural, IV pain meds and Nitrous Oxide  3.How can we help you reach that goal? Be available  Record the patient's initial score and the patient's pain goal.   Pain: 0  Pain Goal: 5 [facility] wants you to be able to say your pain was always managed very well.  [facility] IncMERRITT,Cassidy Meyer 09/01/2017

## 2017-09-01 NOTE — MAU Provider Note (Signed)
S: Ms. Cassidy Meyer is a 22 y.o. G2P0010 at 724w0d  who presents to MAU today complaining of leaking of fluid since 1200. She denies vaginal bleeding. She endorses contractions. She reports normal fetal movement.    O: BP 127/86   Pulse (!) 107   Temp 98.2 F (36.8 C) (Oral)   Resp 18   Ht 5\' 3"  (1.6 m)   Wt 95.7 kg   LMP 12/15/2016 (Within Weeks)   SpO2 99%   BMI 37.38 kg/m  GENERAL: Well-developed, well-nourished female in no acute distress.  HEAD: Normocephalic, atraumatic.  CHEST: Normal effort of breathing, regular heart rate ABDOMEN: Soft, nontender, gravid PELVIC: declined due to patient's multiple visits with same complaint.  Fetal Monitoring: Baseline: 153 Variability: moderate Accelerations: 15x15 Decelerations: none Contractions: irregular uc's  Results for orders placed or performed during the hospital encounter of 09/01/17 (from the past 24 hour(s))  [facility]     Status: None   Collection Time: 09/01/17  8:06 PM  Result Value Ref Range   Amnisure ROM POSITIVE    A: SIUP at 8524w0d  SROM  P: Admit to labor and delivery  Vertex presentation confirmed with bedside u/s Care turned over to labor team.   Rolm Bookbindereill, Caroline M, CNM 09/01/2017 8:22 PM

## 2017-09-01 NOTE — MAU Note (Signed)
Pt states she started leaking this morning but had a larger gush prior to arrival. States the fluid is clear. Pt denies pain but reports some tightening in her stomach. Pt denies vaginal bleeding. Reports good fetal movement.

## 2017-09-02 ENCOUNTER — Inpatient Hospital Stay (HOSPITAL_COMMUNITY): Payer: Managed Care, Other (non HMO) | Admitting: Anesthesiology

## 2017-09-02 ENCOUNTER — Encounter (HOSPITAL_COMMUNITY): Payer: Self-pay | Admitting: Anesthesiology

## 2017-09-02 DIAGNOSIS — Z3A38 38 weeks gestation of pregnancy: Secondary | ICD-10-CM

## 2017-09-02 DIAGNOSIS — O4202 Full-term premature rupture of membranes, onset of labor within 24 hours of rupture: Secondary | ICD-10-CM

## 2017-09-02 DIAGNOSIS — O99824 Streptococcus B carrier state complicating childbirth: Secondary | ICD-10-CM

## 2017-09-02 LAB — CBC
HEMATOCRIT: 35.3 % — AB (ref 36.0–46.0)
HEMOGLOBIN: 11.7 g/dL — AB (ref 12.0–15.0)
MCH: 27.3 pg (ref 26.0–34.0)
MCHC: 33.1 g/dL (ref 30.0–36.0)
MCV: 82.5 fL (ref 78.0–100.0)
Platelets: 141 10*3/uL — ABNORMAL LOW (ref 150–400)
RBC: 4.28 MIL/uL (ref 3.87–5.11)
RDW: 13.5 % (ref 11.5–15.5)
WBC: 12.3 10*3/uL — AB (ref 4.0–10.5)

## 2017-09-02 LAB — RPR: RPR Ser Ql: NONREACTIVE

## 2017-09-02 LAB — ABO/RH: ABO/RH(D): B POS

## 2017-09-02 MED ORDER — DIPHENHYDRAMINE HCL 25 MG PO CAPS: 25.0000 mg | ORAL_CAPSULE | Freq: Four times a day (QID) | ORAL | Status: DC | PRN

## 2017-09-02 MED ORDER — FENTANYL 2.5 MCG/ML BUPIVACAINE 1/10 % EPIDURAL INFUSION (WH - ANES)
14.0000 mL/h | INTRAMUSCULAR | Status: DC | PRN
  Administered 2017-09-02: 14 mL/h via EPIDURAL
  Filled 2017-09-02: qty 100

## 2017-09-02 MED ORDER — DIBUCAINE 1 % RE OINT: 1.0000 "application " | TOPICAL_OINTMENT | RECTAL | Status: DC | PRN

## 2017-09-02 MED ORDER — ONDANSETRON HCL 4 MG PO TABS: 4.0000 mg | ORAL_TABLET | ORAL | Status: DC | PRN

## 2017-09-02 MED ORDER — PHENYLEPHRINE 40 MCG/ML (10ML) SYRINGE FOR IV PUSH (FOR BLOOD PRESSURE SUPPORT)
80.0000 ug | PREFILLED_SYRINGE | INTRAVENOUS | Status: DC | PRN
  Administered 2017-09-02: 80 ug via INTRAVENOUS
  Filled 2017-09-02: qty 5

## 2017-09-02 MED ORDER — SENNOSIDES-DOCUSATE SODIUM 8.6-50 MG PO TABS
2.0000 | ORAL_TABLET | ORAL | Status: DC
  Administered 2017-09-02 – 2017-09-04 (×2): 2 via ORAL
  Filled 2017-09-02 (×2): qty 2

## 2017-09-02 MED ORDER — LACTATED RINGERS IV SOLN: 500.0000 mL | Freq: Once | INTRAVENOUS | Status: DC

## 2017-09-02 MED ORDER — ZOLPIDEM TARTRATE 5 MG PO TABS: 5.0000 mg | ORAL_TABLET | Freq: Every evening | ORAL | Status: DC | PRN

## 2017-09-02 MED ORDER — LACTATED RINGERS IV SOLN
500.0000 mL | Freq: Once | INTRAVENOUS | Status: AC
  Administered 2017-09-02: 500 mL via INTRAVENOUS

## 2017-09-02 MED ORDER — SIMETHICONE 80 MG PO CHEW: 80.0000 mg | CHEWABLE_TABLET | ORAL | Status: DC | PRN

## 2017-09-02 MED ORDER — WITCH HAZEL-GLYCERIN EX PADS: 1.0000 "application " | MEDICATED_PAD | CUTANEOUS | Status: DC | PRN

## 2017-09-02 MED ORDER — COCONUT OIL OIL: 1.0000 "application " | TOPICAL_OIL | Status: DC | PRN

## 2017-09-02 MED ORDER — OXYTOCIN 40 UNITS IN LACTATED RINGERS INFUSION - SIMPLE MED
1.0000 m[IU]/min | INTRAVENOUS | Status: DC
  Administered 2017-09-02: 2 m[IU]/min via INTRAVENOUS
  Filled 2017-09-02: qty 1000

## 2017-09-02 MED ORDER — MISOPROSTOL 50MCG HALF TABLET
50.0000 ug | ORAL_TABLET | ORAL | Status: DC | PRN
  Administered 2017-09-02: 50 ug via ORAL
  Filled 2017-09-02 (×3): qty 1

## 2017-09-02 MED ORDER — EPHEDRINE 5 MG/ML INJ
10.0000 mg | INTRAVENOUS | Status: DC | PRN
  Filled 2017-09-02: qty 2

## 2017-09-02 MED ORDER — ONDANSETRON HCL 4 MG/2ML IJ SOLN: 4.0000 mg | INTRAMUSCULAR | Status: DC | PRN

## 2017-09-02 MED ORDER — OXYCODONE HCL 5 MG PO TABS: 5.0000 mg | ORAL_TABLET | ORAL | Status: DC | PRN

## 2017-09-02 MED ORDER — ACETAMINOPHEN 325 MG PO TABS: 650.0000 mg | ORAL_TABLET | ORAL | Status: DC | PRN

## 2017-09-02 MED ORDER — BENZOCAINE-MENTHOL 20-0.5 % EX AERO: 1.0000 "application " | INHALATION_SPRAY | CUTANEOUS | Status: DC | PRN

## 2017-09-02 MED ORDER — PHENYLEPHRINE 40 MCG/ML (10ML) SYRINGE FOR IV PUSH (FOR BLOOD PRESSURE SUPPORT)
80.0000 ug | PREFILLED_SYRINGE | INTRAVENOUS | Status: DC | PRN
  Filled 2017-09-02: qty 5
  Filled 2017-09-02: qty 10

## 2017-09-02 MED ORDER — TETANUS-DIPHTH-ACELL PERTUSSIS 5-2.5-18.5 LF-MCG/0.5 IM SUSP
0.5000 mL | Freq: Once | INTRAMUSCULAR | Status: AC
  Administered 2017-09-03: 0.5 mL via INTRAMUSCULAR
  Filled 2017-09-02: qty 0.5

## 2017-09-02 MED ORDER — LIDOCAINE-EPINEPHRINE (PF) 2 %-1:200000 IJ SOLN
INTRAMUSCULAR | Status: DC | PRN
  Administered 2017-09-02: 7 mL via EPIDURAL

## 2017-09-02 MED ORDER — PRENATAL MULTIVITAMIN CH
1.0000 | ORAL_TABLET | Freq: Every day | ORAL | Status: DC
  Administered 2017-09-03 – 2017-09-04 (×2): 1 via ORAL
  Filled 2017-09-02 (×2): qty 1

## 2017-09-02 MED ORDER — LIDOCAINE HCL (PF) 1 % IJ SOLN
INTRAMUSCULAR | Status: DC | PRN
  Administered 2017-09-02 (×2): 4 mL via EPIDURAL

## 2017-09-02 MED ORDER — ZOLPIDEM TARTRATE 5 MG PO TABS
5.0000 mg | ORAL_TABLET | Freq: Every evening | ORAL | Status: DC | PRN
  Administered 2017-09-02: 5 mg via ORAL
  Filled 2017-09-02: qty 1

## 2017-09-02 MED ORDER — DIPHENHYDRAMINE HCL 50 MG/ML IJ SOLN: 12.5000 mg | INTRAMUSCULAR | Status: DC | PRN

## 2017-09-02 MED ORDER — IBUPROFEN 600 MG PO TABS
600.0000 mg | ORAL_TABLET | Freq: Four times a day (QID) | ORAL | Status: DC
  Administered 2017-09-02 – 2017-09-04 (×8): 600 mg via ORAL
  Filled 2017-09-02 (×8): qty 1

## 2017-09-02 MED ORDER — TERBUTALINE SULFATE 1 MG/ML IJ SOLN
0.2500 mg | Freq: Once | INTRAMUSCULAR | Status: DC | PRN
  Filled 2017-09-02: qty 1

## 2017-09-02 NOTE — Anesthesia Preprocedure Evaluation (Addendum)
Anesthesia Evaluation  Patient identified by MRN, date of birth, ID band Patient awake    Reviewed: Allergy & Precautions, Patient's Chart, lab work & pertinent test results  Airway Mallampati: II  TM Distance: >3 FB Neck ROM: Full    Dental no notable dental hx. (+) Teeth Intact   Pulmonary asthma , former smoker,    Pulmonary exam normal breath sounds clear to auscultation       Cardiovascular negative cardio ROS Normal cardiovascular exam Rhythm:Regular Rate:Normal     Neuro/Psych  Headaches, PSYCHIATRIC DISORDERS Depression    GI/Hepatic negative GI ROS, Neg liver ROS,   Endo/Other  Obesity  Renal/GU negative Renal ROS  negative genitourinary   Musculoskeletal negative musculoskeletal ROS (+)   Abdominal (+) + obese,   Peds  Hematology  (+) anemia ,   Anesthesia Other Findings   Reproductive/Obstetrics (+) Pregnancy                             Anesthesia Physical Anesthesia Plan  ASA: II  Anesthesia Plan: Epidural   Post-op Pain Management:    Induction:   PONV Risk Score and Plan:   Airway Management Planned: Natural Airway  Additional Equipment:   Intra-op Plan:   Post-operative Plan:   Informed Consent: I have reviewed the patients History and Physical, chart, labs and discussed the procedure including the risks, benefits and alternatives for the proposed anesthesia with the patient or authorized representative who has indicated his/her understanding and acceptance.     Plan Discussed with: Anesthesiologist  Anesthesia Plan Comments:         Anesthesia Quick Evaluation

## 2017-09-02 NOTE — Anesthesia Procedure Notes (Signed)
Epidural Patient location during procedure: OB Start time: 09/02/2017 9:14 AM End time: 09/02/2017 9:23 AM  Preanesthetic Checklist Completed: patient identified, site marked, surgical consent, pre-op evaluation, timeout performed, IV checked, risks and benefits discussed and monitors and equipment checked  Epidural Patient position: sitting Prep: site prepped and draped and DuraPrep Patient monitoring: continuous pulse ox and blood pressure Approach: midline Location: L3-L4 Injection technique: LOR air  Needle:  Needle type: Tuohy  Needle gauge: 17 G Needle length: 9 cm and 9 Needle insertion depth: 8 cm Catheter type: closed end flexible Catheter size: 19 Gauge Catheter at skin depth: 13 cm Test dose: negative and Other  Assessment Events: blood not aspirated, injection not painful, no injection resistance, negative IV test and no paresthesia  Additional Notes Patient identified. Risks and benefits discussed including failed block, incomplete  Pain control, post dural puncture headache, nerve damage, paralysis, blood pressure Changes, nausea, vomiting, reactions to medications-both toxic and allergic and post Partum back pain. All questions were answered. Patient expressed understanding and wished to proceed. Sterile technique was used throughout procedure. Epidural site was Dressed with sterile barrier dressing. No paresthesias, signs of intravascular injection Or signs of intrathecal spread were encountered.  Patient was more comfortable after the epidural was dosed. Please see RN's note for documentation of vital signs and FHR which are stable.

## 2017-09-02 NOTE — Lactation Note (Signed)
This note was copied from a baby's chart. Lactation Consultation Note  Patient Name: Cassidy Meyer WUJWJ'XToday's Date: 09/02/2017 Reason for consult: Initial assessment;Primapara;1st time breastfeeding;Early term 37-38.6wks  P1 mother whose infant is now 793 hours old.  Mother holding baby swaddled in blanket.  Mother has had help once from an RN today for latching and felt like the baby latched well.  She felt no pain with latching.  Encouraged to feed 8-12 times/24 hours or sooner if baby shows feeding cues.  Reviewed feeding cues with mother.  Encouraged hand expression before/after feeds to help milk supply.  Colostrum container provided and milk storage times reviewed.  Mother will call for latch assistance as needed.  Mother and father scrolling their phones the entire time I was speaking with them.  Mother occasionally would look up and respond but father did not make eye contact with me as I spoke.  Visitors present.     Maternal Data Formula Feeding for Exclusion: No Has patient been taught Hand Expression?: Yes Does the patient have breastfeeding experience prior to this delivery?: No  Feeding Feeding Type: Breast Fed Length of feed: 2 min  LATCH Score Latch: Grasps breast easily, tongue down, lips flanged, rhythmical sucking.  Audible Swallowing: None  Type of Nipple: Everted at rest and after stimulation  Comfort (Breast/Nipple): Soft / non-tender  Hold (Positioning): Assistance needed to correctly position infant at breast and maintain latch.  LATCH Score: 7  Interventions    Lactation Tools Discussed/Used WIC Program: No(She may apply)   Consult Status Consult Status: Follow-up Date: 09/03/17 Follow-up type: In-patient    Cassidy Meyer 09/02/2017, 5:11 PM

## 2017-09-02 NOTE — Progress Notes (Signed)
Not in labor, ctx mild. Foley placed in cx.  FHR Cat 1.  VSS.  Oral cytotec and Will start pitocin when foley falls out.

## 2017-09-02 NOTE — Progress Notes (Signed)
Foley fell out around 0500. Cx 4.5/80/-2. Pit at 6 mu/min.  Ctx 2-4 minutes, FHR Cat 1.  Continue w/IOL for PROM

## 2017-09-02 NOTE — Progress Notes (Signed)
Patient ID: Cassidy GardnerJessica Meyer, female   DOB: 04/20/1995, 22 y.o.   MRN: [id number]  Pt appears to be sleeping; not disturbed; just got comfortable w/ epidural  BP 94/68, P 138 FHR 140s, +accels, occ mi variables Ctx q 2-4 mins w/ Pit @ 676mu/min Cx 9/90/0 per RN exam  IUP@term  Transition GBS pos  Plan to check cx in 2 hrs or sooner prn Anticipate SVD  Cassidy Meyer CNM 09/02/2017 10:00 AM

## 2017-09-03 NOTE — Progress Notes (Signed)
Post Partum Day 1, SVD 09/02/17 @ 1334 Subjective: no complaints, up ad lib, voiding, tolerating PO and + flatus  Objective: Blood pressure 115/64, pulse 92, temperature 98.3 F (36.8 C), resp. rate 18, height 5\' 3"  (1.6 m), weight 95.7 kg, last menstrual period 12/15/2016, SpO2 100 %, unknown if currently breastfeeding.  Physical Exam:  General: alert, cooperative, appears stated age and no distress Lochia: appropriate Uterine Fundus: firm Incision: N/A DVT Evaluation: No evidence of DVT seen on physical exam.  Recent Labs    09/01/17 2125 09/02/17 0753  HGB 11.8* 11.7*  HCT 36.6 35.3*    Assessment/Plan: Plan for discharge tomorrow   LOS: 2 days   Calvert CantorSamantha C Maytte Jacot, CNM 09/03/2017, 12:49 PM

## 2017-09-03 NOTE — Progress Notes (Signed)
Mother of baby was referred for history of depression. Referral screened out by CSW due to high census and limited staffing. Per chart review, there are no documented occurrences of symptoms in MOB's prenatal record. MOB's diagnosis originated greater than three years ago.   Please contact CSW if mother of baby requests, if needs arise, or if mother of baby scores greater than a nine or answers yes to question ten on [place] Postpartum Depression Screen.   Edwin Dadaarol Larosa Rhines, MSW, LCSW-A Clinical Social Worker [facility] [phone]

## 2017-09-03 NOTE — Anesthesia Postprocedure Evaluation (Signed)
Anesthesia Post Note  Patient: Cassidy GardnerJessica Meyer  Procedure(s) Performed: AN AD HOC LABOR EPIDURAL     Patient location during evaluation: Mother Baby Anesthesia Type: Epidural Level of consciousness: awake and alert Pain management: pain level controlled Vital Signs Assessment: post-procedure vital signs reviewed and stable Respiratory status: spontaneous breathing, nonlabored ventilation and respiratory function stable Cardiovascular status: stable Postop Assessment: no headache, no backache and epidural receding Anesthetic complications: no    Last Vitals:  Vitals:   09/02/17 2015 09/03/17 0400  BP: 134/83 115/64  Pulse: 98 92  Resp: 18 18  Temp: 36.9 C 36.8 C  SpO2: 99% 100%    Last Pain:  Vitals:   09/03/17 0601  TempSrc:   PainSc: 5    Pain Goal: Patients Stated Pain Goal: 8 (09/02/17 0811)               Trellis PaganiniBREWER,Onur Mori N

## 2017-09-03 NOTE — Plan of Care (Signed)
Pt. Condition will continue to improve 

## 2017-09-04 MED ORDER — IBUPROFEN 600 MG PO TABS: 600.0000 mg | ORAL_TABLET | Freq: Four times a day (QID) | ORAL | 0 refills | Status: DC

## 2017-09-04 NOTE — Discharge Summary (Signed)
OB Discharge Summary  Patient Name: Cassidy GardnerJessica Mcenery DOB: 11/26/1995 MRN: [id number]  Date of admission: 09/01/2017 Delivering MD: Cam HaiSHAW, KIMBERLY D   Date of discharge: 09/04/2017  Admitting diagnosis: 38WKS LEAKING FLUID Intrauterine pregnancy: 6343w1d     Secondary diagnosis:Active Problems:   Normal labor  Additional problems:none     Discharge diagnosis: Term Pregnancy Delivered                                                                     Post partum procedures:none  Augmentation: Pitocin  Complications: None  Hospital course:  Onset of Labor With Vaginal Delivery     22 y.o. yo G2P1011 at 7443w1d was admitted in Active Labor on 09/01/2017. Patient had an uncomplicated labor course as follows:  Membrane Rupture Time/Date: 10:00 AM ,09/01/2017   Intrapartum Procedures: Episiotomy: None [1]                                         Lacerations:  1st degree [2]  Patient had a delivery of a Viable infant. 09/02/2017  Information for the patient's newborn:  Adele DanJackson, Girl Yeilin [[id number]][[id number]]  Delivery Method: Vag-Spont    Pateint had an uncomplicated postpartum course.  She is ambulating, tolerating a regular diet, passing flatus, and urinating well. Patient is discharged home in stable condition on 09/04/17.   Physical exam  Vitals:   09/03/17 0400 09/03/17 1623 09/03/17 2153 09/04/17 0621  BP: 115/64 113/65 139/83 128/81  Pulse: 92 99 93 79  Resp: 18 17 18 18   Temp: 98.3 F (36.8 C) 98.2 F (36.8 C) 99.1 F (37.3 C) 98.4 F (36.9 C)  TempSrc:  Oral Oral Oral  SpO2: 100%  99% 99%  Weight:      Height:       General: alert, cooperative and no distress Lochia: appropriate Uterine Fundus: firm Incision: Healing well with no significant drainage, No significant erythema, Dressing is clean, dry, and intact DVT Evaluation: No evidence of DVT seen on physical exam. Labs: Lab Results  Component Value Date   WBC 12.3 (H) 09/02/2017   HGB 11.7 (L) 09/02/2017   HCT 35.3 (L) 09/02/2017   MCV 82.5 09/02/2017   PLT 141 (L) 09/02/2017   CMP Latest Ref Rng & Units 01/22/2017  Glucose 65 - 99 mg/dL 91  BUN 6 - 20 mg/dL 11  Creatinine 7.820.44 - 9.561.00 mg/dL 2.130.70  Sodium 086135 - 578145 mmol/L 136  Potassium 3.5 - 5.1 mmol/L 3.2(L)  Chloride 101 - 111 mmol/L 100(L)  CO2 22 - 32 mmol/L 25  Calcium 8.9 - 10.3 mg/dL 9.5  Total Protein 6.5 - 8.1 g/dL 7.5  Total Bilirubin 0.3 - 1.2 mg/dL 0.7  Alkaline Phos 38 - 126 U/L 47  AST 15 - 41 U/L 20  ALT 14 - 54 U/L 13(L)    Discharge instruction: per After Visit Summary and "Baby and Me Booklet".  After Visit Meds:  Allergies as of 09/04/2017   No Known Allergies     Medication List    TAKE these medications   COMFORT FIT MATERNITY SUPP MED Misc Wear daily when ambulating  ibuprofen 600 MG tablet Commonly known as:  ADVIL,MOTRIN Take 1 tablet (600 mg total) by mouth every 6 (six) hours.   PRENATAL VITAMINS PO Take 1 tablet by mouth daily.       Diet: routine diet  Activity: Advance as tolerated. Pelvic rest for 6 weeks.   Outpatient follow up:6 weeks Follow up Appt: Future Appointments  Date Time Provider Department Center  09/06/2017  3:15 PM Hermina StaggersErvin, Michael L, MD [facility] None   Follow up visit: No follow-ups on file.  Postpartum contraception: Condoms  Newborn Data: Live born female  Birth Weight: 8 lb 6.4 oz (3810 g) APGAR: 7, 8  Newborn Delivery   Birth date/time:  09/02/2017 13:34:00 Delivery type:  Vaginal, Spontaneous     Baby Feeding: Bottle and Breast Disposition:home with mother   09/04/2017 Wyvonnia DuskyMarie Romey Mathieson, CNM

## 2017-09-04 NOTE — Lactation Note (Signed)
This note was copied from a baby's chart. Lactation Consultation Note  Patient Name: Girl Stacy GardnerJessica Remo UJWJX'BToday's Date: 09/04/2017 Reason for consult: Follow-up assessment  Visited with P1 Mom of 6643 hr old baby ET infant at 7% weight loss.  Output-3 voids and 1 stool last 24 hrs. When asked how breastfeeding was going, Mom stated well but she thought she would give baby some formula.  Mom concerned that she doesn't have enough for baby.   Encouraged Mom to hand express, or use double pump (set up at bedside) to offer more of her milk to baby. Baby started cueing, so offered to assess baby's latch.  Encouraged hand expression prior to latch.  Small glistening noted. Mom using cross cradle hold.  Mom guided to use a U hold to facilitate a deeper latch.  Baby opens widely, and with some sandwiching of breast, and assist Mom to support baby's head well, baby attain a deep, and wide latch to breast.  Helped Mom identify swallows, baby having intermittent and regular swallows.  Taught Mom how to use alternate breast compression to increase milk transfer.  Plan- 1- Keep baby STS as much as possible 2- feed baby when she cues she is hungry.  Goal is 8-12 feedings per 24 hrs.  A deep latch is most important. 3- Feed baby on both breasts at each feeding. 4- hand express and double pump for 15 mins if formula given. [facility]- [facility] any guidance prn.  OP Support group/Lactation appointment available   Explained to Mom that LC did not see a medical reason to supplement baby.  But if she chose to, to pump both breasts for 15-20 mins (Mom has a DEBP at home).  Engorgement prevention and treatment discussed.  Mom to call prn.   Feeding Feeding Type: Breast Fed Length of feed: 15 min  LATCH Score Latch: Grasps breast easily, tongue down, lips flanged, rhythmical sucking.  Audible Swallowing: Spontaneous and intermittent  Type of Nipple: Everted at rest and after stimulation  Comfort (Breast/Nipple):  Soft / non-tender  Hold (Positioning): Assistance needed to correctly position infant at breast and maintain latch.  LATCH Score: 9  Interventions Interventions: Breast feeding basics reviewed;Assisted with latch;Skin to skin;Breast massage;Hand express;Breast compression;Adjust position;Support pillows;Position options;Expressed milk   Consult Status Consult Status: Complete Date: 09/04/17 Follow-up type: Call as needed    Judee ClaraSmith, Chastidy Ranker E 09/04/2017, 9:14 AM

## 2017-09-06 ENCOUNTER — Encounter: Payer: Managed Care, Other (non HMO) | Admitting: Obstetrics and Gynecology

## 2017-09-12 ENCOUNTER — Encounter: Payer: Self-pay | Admitting: *Deleted

## 2017-09-29 ENCOUNTER — Encounter: Payer: Self-pay | Admitting: Obstetrics and Gynecology

## 2017-09-29 ENCOUNTER — Ambulatory Visit (INDEPENDENT_AMBULATORY_CARE_PROVIDER_SITE_OTHER): Payer: Managed Care, Other (non HMO) | Admitting: Obstetrics and Gynecology

## 2017-09-29 DIAGNOSIS — Z1389 Encounter for screening for other disorder: Secondary | ICD-10-CM | POA: Diagnosis not present

## 2017-09-29 NOTE — Progress Notes (Signed)
Post Partum Exam  Cassidy GardnerJessica Serrao is a 22 y.o. 602P1011 female who presents for a postpartum visit. She is 4 weeks postpartum following a spontaneous vaginal delivery. I have fully reviewed the prenatal and intrapartum course. The delivery was at 39 gestational weeks.  Anesthesia: epidural. Postpartum course has been doing well. Baby's course has been doing well. Baby is feeding by bottle - parents choice-sensitive. Bleeding thin lochia. Bowel function is normal. Bladder function is normal. Patient is not sexually active. Contraception method is abstinence.  Postpartum depression screening:neg, score 0.    Last pap smear done 01/2017 and was Normal  Review of Systems Pertinent items are noted in HPI.    Objective:  Blood pressure 123/78, pulse 79, weight 196 lb (88.9 kg), unknown if currently breastfeeding.  General:  alert, cooperative and no distress   Breasts:  inspection negative, no nipple discharge or bleeding, no masses or nodularity palpable  Lungs: clear to auscultation bilaterally  Heart:  regular rate and rhythm  Abdomen: soft, non-tender; bowel sounds normal; no masses,  no organomegaly   Vulva:  normal  Vagina: normal vagina, no discharge, exudate, lesion, or erythema  Cervix:  multiparous appearance  Corpus: normal size, contour, position, consistency, mobility, non-tender  Adnexa:  normal adnexa and no mass, fullness, tenderness  Rectal Exam: Not performed.        Assessment:    Normal postpartum exam. Pap smear not done at today's visit.   Plan:   1. Contraception: condoms. Patient declined other contraceptive methods 2. Patient is medically cleared to resume all activities of daily living 3. Follow up in: 6 months for annual exam or as needed.

## 2018-07-10 ENCOUNTER — Other Ambulatory Visit: Payer: Managed Care, Other (non HMO)

## 2018-07-10 ENCOUNTER — Ambulatory Visit: Payer: Self-pay | Admitting: *Deleted

## 2018-07-10 ENCOUNTER — Other Ambulatory Visit: Payer: Self-pay

## 2018-07-10 DIAGNOSIS — Z20822 Contact with and (suspected) exposure to covid-19: Secondary | ICD-10-CM

## 2018-07-10 NOTE — Telephone Encounter (Signed)
Patient requesting [phone] testing due to possible exposure at work, [employer], [employer]. Last contact with positive person at work was 07/04/18. Slight ST for several days. No fever/SOB and denies all other symptoms. She will continue to quarantine/hand sanitizer/disinfecting the common household areas for the remaining of 10 days since contact with the positive person. She will monitor temperature and for other symptoms of all household members. Reviewed must be without symptoms for 3 days without any antipyretics before discontinuing isolation, them will continue to wear mask social distance and frequent hand washing. Call back if she wants an appointment.  Reason for Disposition . [1] COVID-19 EXPOSURE (Close Contact) AND [2] within last 14 days BUT [3] NO symptoms  Answer Assessment - Initial Assessment Questions 1. CLOSE CONTACT: "Who is the person with the confirmed or suspected COVID-19 infection that you were exposed to?"     Co worker 2. 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 TYPE of CONTACT: "How much contact was there?" (e.g., sitting next to, live in same house, work in same office, same building)     Same work space  4. DURATION of CONTACT: "How long were you in contact with the COVID-19 patient?" (e.g., a few seconds, passed by person, a few minutes, live with the patient)     daily 5. DATE of CONTACT: "When did you have contact with a COVID-19 patient?" (e.g., how many days ago)    Last on 07/04/18 6. TRAVEL: "Have you traveled out of the country recently?" If so, "When and where?"No     * Also ask about out-of-state travel, since the CDC has identified some high-risk cities for community spread in the [place].     * Note: Travel becomes less relevant if there is widespread community transmission where the  patient lives.      7. COMMUNITY SPREAD: "Are there lots of cases of COVID-19 (community spread) where you live?" (See public health department website, if unsure)       yes 8. SYMPTOMS: "Do you have any symptoms?" (e.g., fever, cough, breathing difficulty)     no 9. PREGNANCY OR POSTPARTUM: "Is there any chance you are pregnant?" "When was your last menstrual period?" "Did you deliver in the last 2 weeks?"     no 10. HIGH RISK: "Do you have any heart or lung problems? Do you have a weak immune system?" (e.g., CHF, COPD, asthma, HIV positive, chemotherapy, renal failure, diabetes mellitus, sickle cell anemia)       none  Protocols used: CORONAVIRUS (COVID-19) EXPOSURE-A-AH

## 2018-07-13 ENCOUNTER — Telehealth: Payer: Self-pay | Admitting: *Deleted

## 2018-07-13 LAB — NOVEL CORONAVIRUS, NAA: SARS-CoV-2, NAA: NOT DETECTED

## 2018-07-13 NOTE — Telephone Encounter (Signed)
Pt called in requesting her COVID-19 results.   I let her know it was not detected which means it was negative.   She thanked me for my help.

## 2019-07-20 IMAGING — US US MFM OB COMP +14 WKS
1 series · 14 of 28 positions shown · non-contrast
Comparison: none

[Series 1: us mfm ob comp +14 wks · 71 acquisitions, 14 frames shown]
[im 3/71]
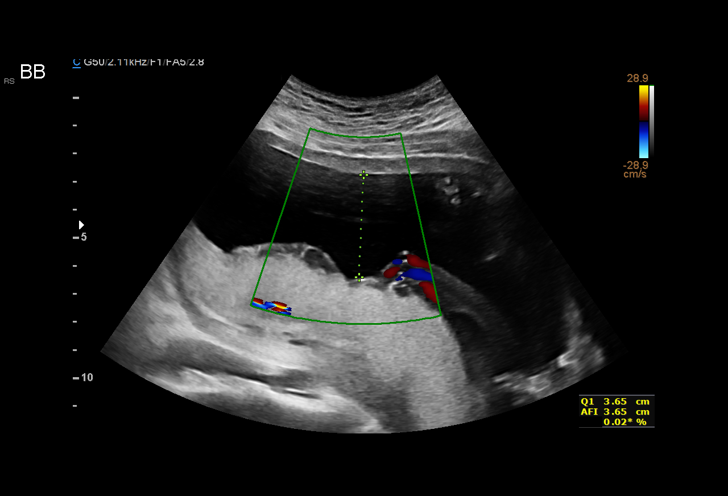
[im 8/71]
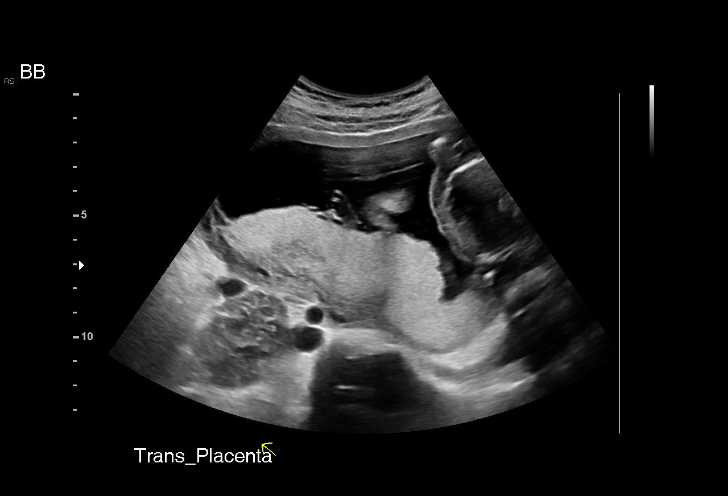
[im 13/71]
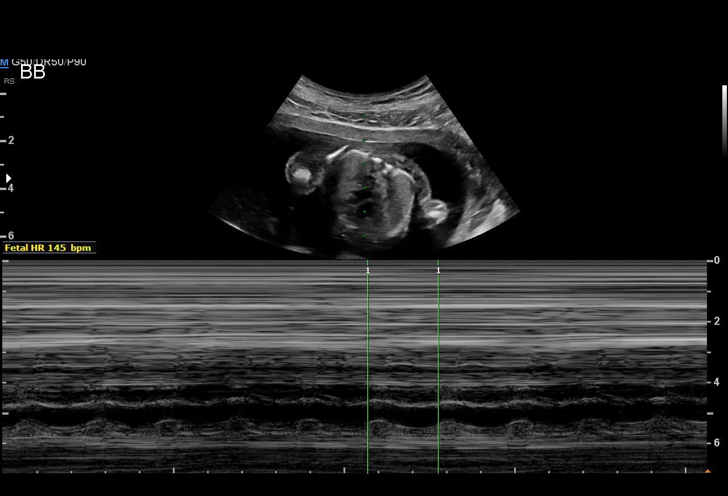
[im 19/71]
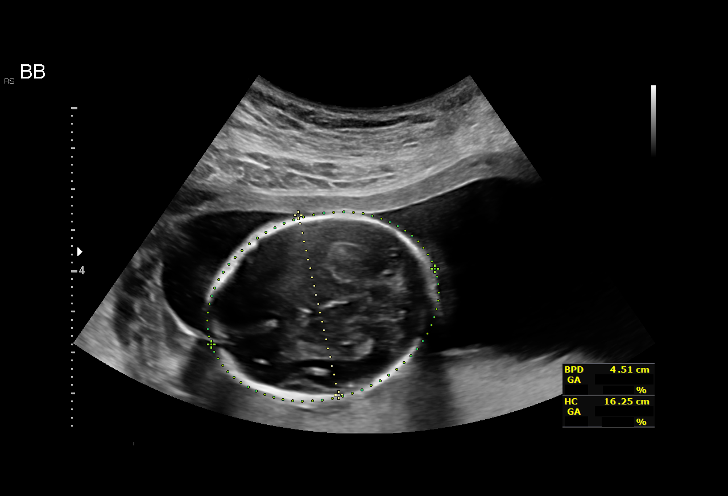
[im 24/71]
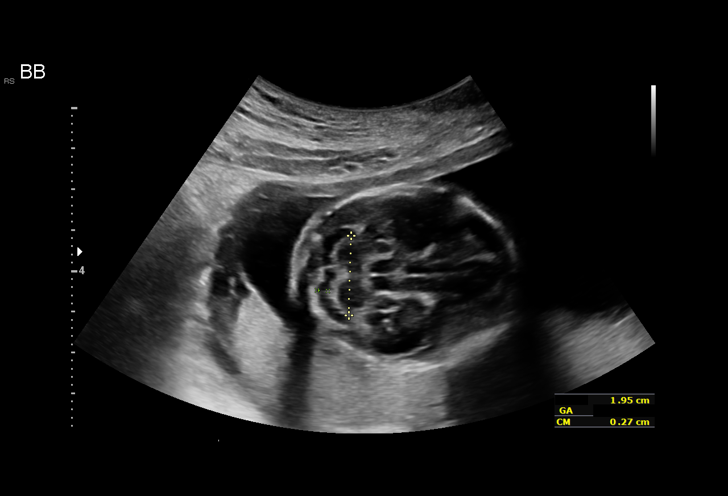
[im 29/71]
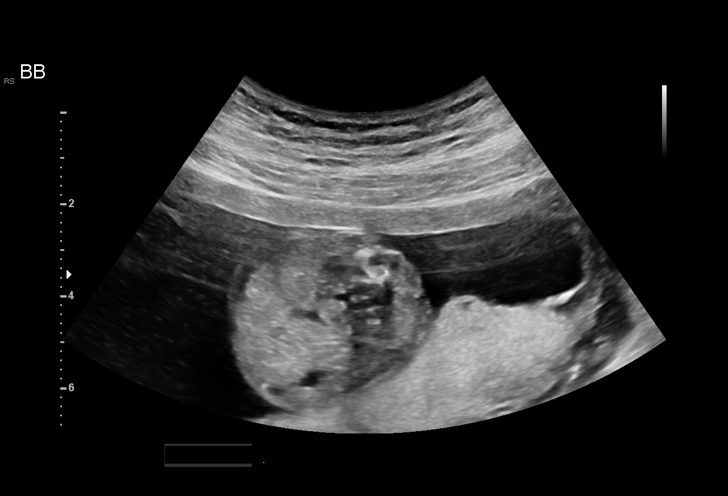
[im 34/71]
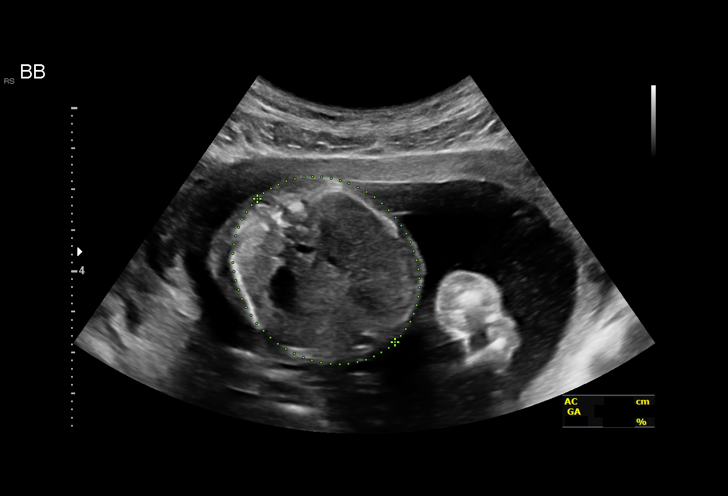
[im 39/71]
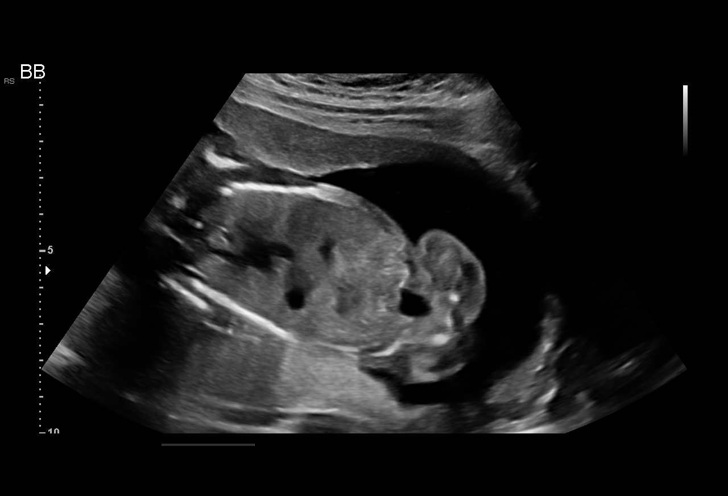
[im 45/71]
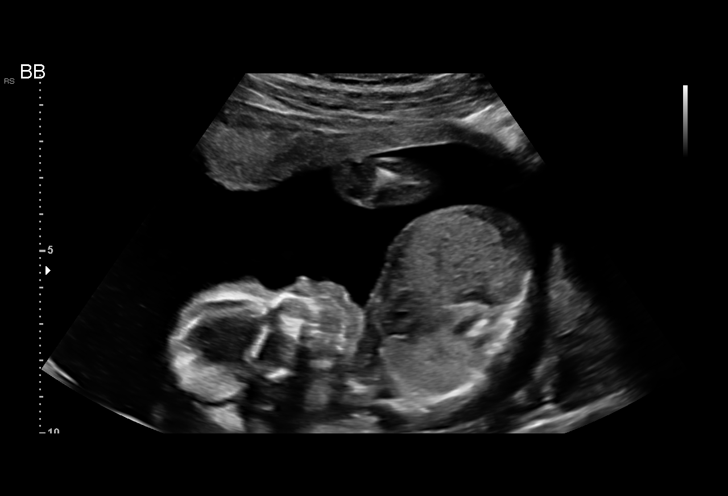
[im 50/71]
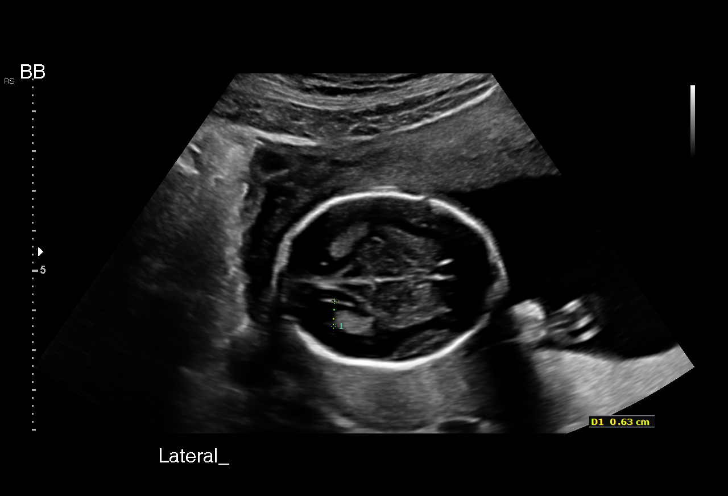
[im 55/71]
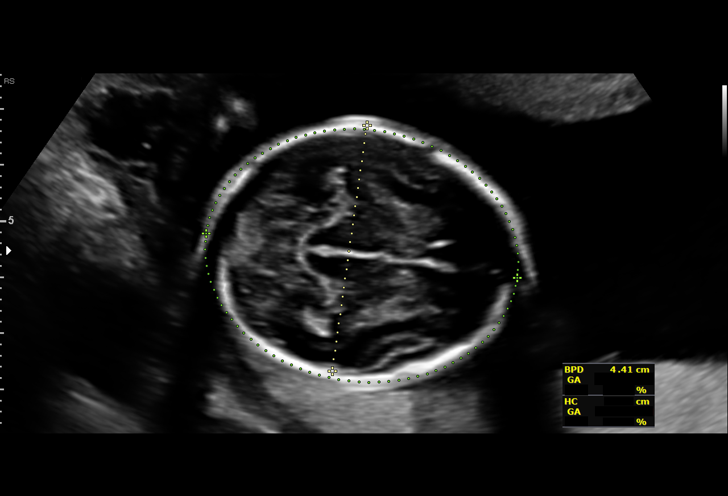
[im 60/71]
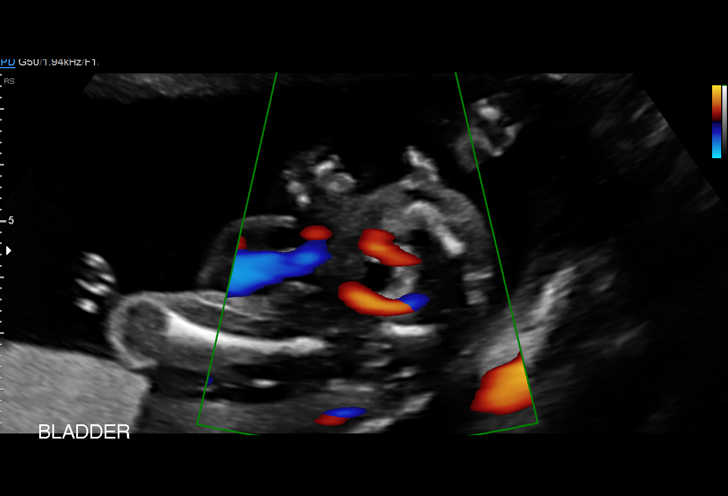
[im 65/71]
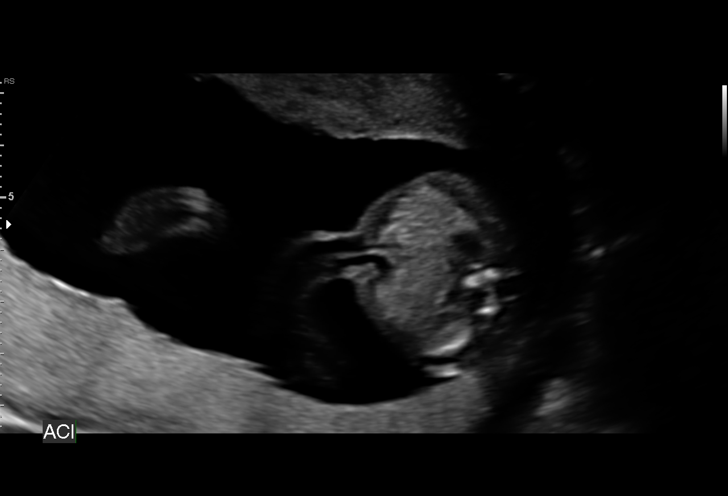
[im 71/71]
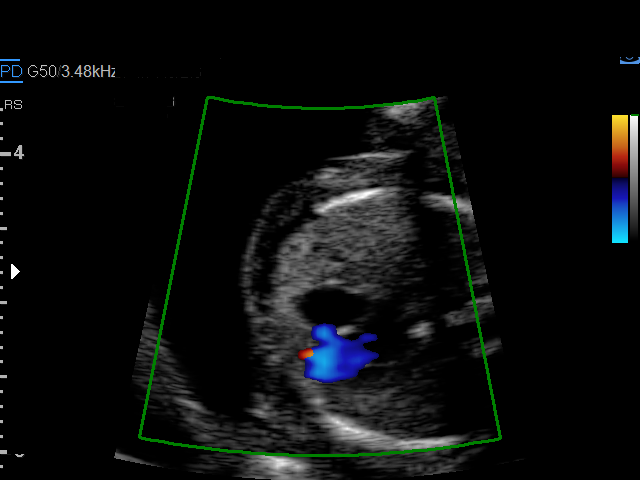

[14 of 28 positions shown; findings below may reference images not displayed]

for [REDACTED]care

Indications

18 weeks gestation of pregnancy
Encounter for antenatal screening for
malformations
OB History

Blood Type:            Height:  5'4"   Weight (lb):  141       BMI:
Gravidity:    3         Term:   0        Prem:   0        SAB:   2
TOP:          0       Ectopic:  0        Living: 0
Fetal Evaluation

Num Of Fetuses:     1
Fetal Heart         145
Rate(bpm):
Cardiac Activity:   Observed
Presentation:       Breech
Placenta:           Posterior, above cervical os
P. Cord Insertion:  Visualized, central

Amniotic Fluid
AFI FV:      Subjectively within normal limits

AFI Sum(cm)     %Tile       Largest Pocket(cm)
3.65            < 3

RUQ(cm)
3.65
Biometry

BPD:      44.9  mm     G. Age:  19w 4d         80  %    CI:         76.7   %    70 - 86
FL/HC:      18.5   %    16.1 -
HC:      162.4  mm     G. Age:  19w 0d         51  %    HC/AC:      1.13        1.09 -
AC:      143.6  mm     G. Age:  19w 5d         74  %    FL/BPD:     66.8   %
FL:         30  mm     G. Age:  19w 2d         58  %    FL/AC:      20.9   %    20 - 24
HUM:        30  mm     G. Age:  19w 6d         77  %
CER:      19.5  mm     G. Age:  18w 5d         47  %
CM:        2.7  mm

Est. FW:     293  gm    0 lb 10 oz      54  %
Gestational Age

U/S Today:     19w 3d                                        EDD:   09/11/17
Best:          18w 6d     Det. By:  Early Ultrasound         EDD:   09/15/17
(01/22/17)
Anatomy

Cranium:               Appears normal         Aortic Arch:            Not well visualized
Cavum:                 Appears normal         Ductal Arch:            Not well visualized
Ventricles:            Appears normal         Diaphragm:              Appears normal
Choroid Plexus:        Appears normal         Stomach:                Appears normal, left
sided
Cerebellum:            Appears normal         Abdomen:                Appears normal
Posterior Fossa:       Appears normal         Abdominal Wall:         Appears nml (cord
insert, abd wall)
Nuchal Fold:           Appears normal         Cord Vessels:           Appears normal (3
vessel cord)
Face:                  Orbits nl; profile not Kidneys:                Appear normal
well visualized
Lips:                  Not well visualized    Bladder:                Appears normal
Thoracic:              Appears normal         Spine:                  Appears normal
Heart:                 Not well visualized    Upper Extremities:      Appears normal
RVOT:                  Appears normal         Lower Extremities:      Appears normal
LVOT:                  Not well visualized

Other:  Nasal bone visualized. Parents do not wish to know sex of fetus.
Technically difficult due to fetal position.
Cervix Uterus Adnexa

Cervix
Length:              3  cm.
Normal appearance by transabdominal scan.

Uterus
No abnormality visualized.

Left Ovary
No adnexal mass visualized.

Right Ovary
No adnexal mass visualized.

Cul De Sac:   No free fluid seen.

Adnexa:       No abnormality visualized.
Impression

Singleton intrauterine pregnancy at 18+6 weeks here for
anatomic survey
Review of the anatomy shows no sonographic markers for
aneuploidy or structural anomalies
However, views of the fetal heart should be considered
suboptimal secondary to fetal position
Amniotic fluid volume is normal
Estimated fetal weight shows growth in the 54th percentile
Recommendations

Recommend follow-up ultrasound examination in 4 weeks for
completion of the anatomic survey

## 2019-12-30 ENCOUNTER — Other Ambulatory Visit: Payer: Self-pay

## 2019-12-30 ENCOUNTER — Emergency Department (HOSPITAL_COMMUNITY)
Admission: EM | Admit: 2019-12-30 | Discharge: 2019-12-30 | Disposition: A | Discharge status: Home / Self Care | Payer: 59 | Attending: Emergency Medicine | Admitting: Emergency Medicine

## 2019-12-30 ENCOUNTER — Encounter (HOSPITAL_COMMUNITY): Payer: Self-pay | Admitting: Emergency Medicine

## 2019-12-30 DIAGNOSIS — Z5321 Procedure and treatment not carried out due to patient leaving prior to being seen by health care provider: Secondary | ICD-10-CM | POA: Diagnosis not present

## 2019-12-30 DIAGNOSIS — R1084 Generalized abdominal pain: Secondary | ICD-10-CM | POA: Diagnosis not present

## 2019-12-30 DIAGNOSIS — R1110 Vomiting, unspecified: Secondary | ICD-10-CM | POA: Insufficient documentation

## 2019-12-30 LAB — COMPREHENSIVE METABOLIC PANEL
ALT: 12 U/L (ref 0–44)
AST: 20 U/L (ref 15–41)
Albumin: 4.4 g/dL (ref 3.5–5.0)
Alkaline Phosphatase: 46 U/L (ref 38–126)
Anion gap: 12 (ref 5–15)
BUN: <5 mg/dL — ABNORMAL LOW (ref 6–20)
CO2: 24 mmol/L (ref 22–32)
Calcium: 9.6 mg/dL (ref 8.9–10.3)
Chloride: 98 mmol/L (ref 98–111)
Creatinine, Ser: 0.62 mg/dL (ref 0.44–1.00)
GFR, Estimated: >60 mL/min (ref >60)
Glucose, Bld: 94 mg/dL (ref 70–99)
Potassium: 3.5 mmol/L (ref 3.5–5.1)
Sodium: 134 mmol/L — ABNORMAL LOW (ref 135–145)
Total Bilirubin: 0.7 mg/dL (ref 0.3–1.2)
Total Protein: 7.4 g/dL (ref 6.5–8.1)

## 2019-12-30 LAB — CBC
HCT: 41.2 % (ref 36.0–46.0)
Hemoglobin: 13.5 g/dL (ref 12.0–15.0)
MCH: 27.6 pg (ref 26.0–34.0)
MCHC: 32.8 g/dL (ref 30.0–36.0)
MCV: 84.1 fL (ref 80.0–100.0)
Platelets: 214 10*3/uL (ref 150–400)
RBC: 4.9 MIL/uL (ref 3.87–5.11)
RDW: 14 % (ref 11.5–15.5)
WBC: 6.2 10*3/uL (ref 4.0–10.5)
nRBC: 0 % (ref 0.0–0.2)

## 2019-12-30 LAB — I-STAT BETA HCG BLOOD, ED (MC, WL, AP ONLY): I-stat hCG, quantitative: <5 m[IU]/mL (ref <5)

## 2019-12-30 LAB — LIPASE, BLOOD: Lipase: 21 U/L (ref 11–51)

## 2019-12-30 NOTE — ED Triage Notes (Signed)
Pt states she participated in the "1 chip challenge" approx 1 1/2 hours ago and ate "the hottest chip in [place]".  C/o generalized abd pain and vomited x 3.  States it feels like she ate "acid" and is requesting her "stomach to be pumped".

## 2019-12-30 NOTE — ED Notes (Signed)
Pt stated that she is going to go to Urgent Care. Pt will be moved OTF.

## 2022-08-13 ENCOUNTER — Inpatient Hospital Stay (HOSPITAL_COMMUNITY)
Admission: AD | Admit: 2022-08-13 | Discharge: 2022-08-14 | Disposition: A | Discharge status: Home / Self Care | Payer: Medicaid Other | Attending: Family Medicine | Admitting: Family Medicine

## 2022-08-13 DIAGNOSIS — R1010 Upper abdominal pain, unspecified: Secondary | ICD-10-CM | POA: Diagnosis present

## 2022-08-13 DIAGNOSIS — R1013 Epigastric pain: Secondary | ICD-10-CM | POA: Insufficient documentation

## 2022-08-13 DIAGNOSIS — Z3A01 Less than 8 weeks gestation of pregnancy: Secondary | ICD-10-CM | POA: Diagnosis not present

## 2022-08-13 DIAGNOSIS — K5900 Constipation, unspecified: Secondary | ICD-10-CM | POA: Diagnosis present

## 2022-08-13 DIAGNOSIS — O26891 Other specified pregnancy related conditions, first trimester: Secondary | ICD-10-CM | POA: Diagnosis not present

## 2022-08-13 DIAGNOSIS — Z87891 Personal history of nicotine dependence: Secondary | ICD-10-CM | POA: Diagnosis not present

## 2022-08-13 LAB — POCT PREGNANCY, URINE: Preg Test, Ur: POSITIVE — AB

## 2022-08-14 ENCOUNTER — Inpatient Hospital Stay (HOSPITAL_COMMUNITY): Payer: Medicaid Other

## 2022-08-14 ENCOUNTER — Encounter (HOSPITAL_COMMUNITY): Payer: Self-pay | Admitting: *Deleted

## 2022-08-14 DIAGNOSIS — R1013 Epigastric pain: Secondary | ICD-10-CM

## 2022-08-14 DIAGNOSIS — Z3A01 Less than 8 weeks gestation of pregnancy: Secondary | ICD-10-CM | POA: Diagnosis not present

## 2022-08-14 LAB — CBC
HCT: 37.3 % (ref 36.0–46.0)
Hemoglobin: 12.7 g/dL (ref 12.0–15.0)
MCH: 28.4 pg (ref 26.0–34.0)
MCHC: 34 g/dL (ref 30.0–36.0)
MCV: 83.4 fL (ref 80.0–100.0)
Platelets: 195 10*3/uL (ref 150–400)
RBC: 4.47 MIL/uL (ref 3.87–5.11)
RDW: 14.1 % (ref 11.5–15.5)
WBC: 5.4 10*3/uL (ref 4.0–10.5)
nRBC: 0 % (ref 0.0–0.2)

## 2022-08-14 LAB — HCG, QUANTITATIVE, PREGNANCY: hCG, Beta Chain, Quant, S: 7815 m[IU]/mL — ABNORMAL HIGH (ref <5)

## 2022-08-14 MED ORDER — FAMOTIDINE 20 MG PO TABS: 20.0000 mg | ORAL_TABLET | Freq: Two times a day (BID) | ORAL | 3 refills | Status: DC

## 2022-08-14 MED ORDER — ALUM & MAG HYDROXIDE-SIMETH 200-200-20 MG/5ML PO SUSP
30.0000 mL | Freq: Once | ORAL | Status: AC
  Administered 2022-08-14: 30 mL via ORAL
  Filled 2022-08-14: qty 30

## 2022-08-14 MED ORDER — DICYCLOMINE HCL 10 MG/5ML PO SOLN
20.0000 mg | Freq: Once | ORAL | Status: AC
  Administered 2022-08-14: 20 mg via ORAL
  Filled 2022-08-14: qty 10

## 2022-08-14 NOTE — MAU Provider Note (Signed)
History     CSN: [id number]  Arrival date and time: 08/13/22 2315   Event Date/Time   First Provider Initiated Contact with Patient 08/14/22 0024      Chief Complaint  Patient presents with   Abdominal Pain    Cassidy Meyer is a 27 y.o. G3P101 at [redacted]w[redacted]d by Definite LMP of 07/06/2022 who receives care at [facility].  She presents today for epigastric pain.  She states the pain started tonight around 9pm.  She describes the pain as a sharp shooting pain that is consistent.  She denies worsening or relieving factors for the pain.  She rates the pain a 7/10.  She denies lower abdominal cramping or vaginal bleeding or discharge.  6pm: [employer] and Cassidy Meyer 2 bottles of water  OB History     Gravida  3   Para  1   Term  1   Preterm      AB  1   Living  1      SAB      IAB  1   Ectopic      Multiple  0   Live Births  1           Past Medical History:  Diagnosis Date   Asthma    Bronchitis    Headache     Past Surgical History:  Procedure Laterality Date   APPENDECTOMY     HERNIA REPAIR      Family History  Problem Relation Age of Onset   Hypertension Mother    Cancer Maternal Aunt        bladder   Dementia Maternal Grandmother    Diabetes Neg Hx     Social History   Tobacco Use   Smoking status: Former    Current packs/day: 0.25    Average packs/day: 0.3 packs/day for 7.0 years (1.8 ttl pk-yrs)    Types: Cigarettes   Smokeless tobacco: Never  Vaping Use   Vaping status: Never Used  Substance Use Topics   Alcohol use: No   Drug use: No    Allergies: No Known Allergies  Medications Prior to Admission  Medication Sig Dispense Refill Last Dose   Elastic Bandages & Supports (COMFORT FIT MATERNITY SUPP MED) MISC Wear daily when ambulating 1 each 0    ibuprofen (ADVIL,MOTRIN) 600 MG tablet Take 1 tablet (600 mg total) by mouth every 6 (six) hours. 30 tablet 0    Prenatal Multivit-Min-Fe-FA (PRENATAL  VITAMINS PO) Take 1 tablet by mouth daily.        Review of Systems  Constitutional:  Negative for chills and fever.  Gastrointestinal:  Positive for abdominal pain (Epigastric) and constipation. Negative for diarrhea, nausea and vomiting.  Genitourinary:  Negative for difficulty urinating, dysuria, vaginal bleeding and vaginal discharge.   Physical Exam   Blood pressure 116/62, pulse 79, temperature 98.2 F (36.8 C), temperature source Oral, resp. rate 16, height 5\' 4"  (1.626 m), weight 70.1 kg, last menstrual period 07/06/2022, unknown if currently breastfeeding.  Physical Exam Vitals reviewed.  Constitutional:      General: She is not in acute distress.    Appearance: She is well-developed.  HENT:     Head: Normocephalic and atraumatic.  Eyes:     Conjunctiva/sclera: Conjunctivae normal.  Cardiovascular:     Rate and Rhythm: Normal rate.     Heart sounds: Normal heart sounds.  Pulmonary:     Effort: Pulmonary effort is normal. No respiratory distress.  Breath sounds: Normal breath sounds.  Abdominal:     General: Abdomen is flat.     Palpations: Abdomen is soft.     Tenderness: There is abdominal tenderness in the epigastric area. There is no guarding or rebound.  Musculoskeletal:        General: Normal range of motion.     Cervical back: Normal range of motion.  Skin:    General: Skin is warm and dry.  Neurological:     Mental Status: She is alert and oriented to person, place, and time.  Psychiatric:        Mood and Affect: Mood normal.        Behavior: Behavior normal.     MAU Course  Procedures Results for orders placed or performed during the hospital encounter of 08/13/22 (from the past 24 hour(s))  Pregnancy, urine POC     Status: Abnormal   Collection Time: 08/13/22 11:42 PM  Result Value Ref Range   Preg Test, Ur POSITIVE (A) NEGATIVE  Urinalysis, Routine w reflex microscopic -Urine, Clean Catch     Status: None   Collection Time: 08/13/22 11:45 PM   Result Value Ref Range   Color, Urine YELLOW YELLOW   APPearance CLEAR CLEAR   Specific Gravity, Urine 1.014 1.005 - 1.030   pH 7.0 5.0 - 8.0   Glucose, UA NEGATIVE NEGATIVE mg/dL   Hgb urine dipstick NEGATIVE NEGATIVE   Bilirubin Urine NEGATIVE NEGATIVE   Ketones, ur NEGATIVE NEGATIVE mg/dL   Protein, ur NEGATIVE NEGATIVE mg/dL   Nitrite NEGATIVE NEGATIVE   Leukocytes,Ua NEGATIVE NEGATIVE   RBC / HPF 0-5 0 - 5 RBC/hpf   WBC, UA 0-5 0 - 5 WBC/hpf   Bacteria, UA NONE SEEN NONE SEEN   Squamous Epithelial / HPF 0-5 0 - 5 /HPF  CBC     Status: None   Collection Time: 08/14/22  1:37 AM  Result Value Ref Range   WBC 5.4 4.0 - 10.5 K/uL   RBC 4.47 3.87 - 5.11 MIL/uL   Hemoglobin 12.7 12.0 - 15.0 g/dL   HCT 40.9 81.1 - 91.4 %   MCV 83.4 80.0 - 100.0 fL   MCH 28.4 26.0 - 34.0 pg   MCHC 34.0 30.0 - 36.0 g/dL   RDW 78.2 95.6 - 21.3 %   Platelets 195 150 - 400 K/uL   nRBC 0.0 0.0 - 0.2 %  hCG, quantitative, pregnancy     Status: Abnormal   Collection Time: 08/14/22  1:37 AM  Result Value Ref Range   hCG, Beta Chain, Quant, S 7,815 (H) <5 mIU/mL    MDM Exam GI cocktail RUQ ultrasound Bentyl Prescription Outpatient ultrasound Assessment and Plan  27 year old G3P1011 at 5.4 weeks Epigastric Pain  -Discussed potential causes for epigastric pain including gallstones and heartburn. -Reviewed usage of GI cocktail and patient agreeable. -Will also send for abdominal ultrasound to rule out gallstones. -Patient offered and declines pain medication at current. -Monitor and await results  Cassidy Meyer 08/14/2022, 12:24 AM   Reassessment (1:20 AM) -Ultrasound returns unremarkable. -Patient reports continued discomfort despite GI cocktail. -Discussed Bentyl and patient agreeable. -Will return to reassess. -Patient informed that we find for her does not due to [facility] services and that she will need to obtain appropriate provider.  This to be given with discharge  paperwork  Reassessment (3:22 AM) -Patient reports some improvement in epigastric pain. -States pain has improved and is now a 4/10, which is manageable. -Patient questions  appropriate medications for constipation during pregnancy. -Will give a list of pregnancy safe medications. -Will also send prescription for famotidine and patient instructed to take once daily and increase to twice daily as needed. -Precautions given. -Will send for viability ultrasound as outpatient.  Order placed for [place] location. -Patient agreeable without further questions. -He is to return if symptoms worsen or with onset of new symptoms. -Discharged to home in stable condition.  Cherre Robins MSN, CNM Advanced Practice Provider, [facility]

## 2022-08-14 NOTE — Discharge Instructions (Addendum)
Safe Medications in Pregnancy   Acne: Benzoyl Peroxide Salicylic Acid  Backache/Headache: Tylenol: 2 regular strength every 4 hours OR              2 Extra strength every 6 hours  Colds/Coughs/Allergies: Benadryl (alcohol free) 25 mg every 6 hours as needed Breath right strips Claritin Cepacol throat lozenges Chloraseptic throat spray Cold-Eeze- up to three times per day Cough drops, alcohol free Flonase (by prescription only) Guaifenesin Mucinex Robitussin DM (plain only, alcohol free) Saline nasal spray/drops Sudafed (pseudoephedrine) & Actifed ** use only after [redacted] weeks gestation and if you do not have high blood pressure Tylenol Vicks Vaporub Zinc lozenges Zyrtec   Constipation: Colace Ducolax suppositories Fleet enema Glycerin suppositories Metamucil Milk of magnesia Miralax Senokot Smooth move tea  Diarrhea: Kaopectate Imodium A-D  *NO pepto Bismol  Hemorrhoids: Anusol Anusol HC Preparation H Tucks  Indigestion: Tums Maalox Mylanta Zantac  Pepcid  Insomnia: Benadryl (alcohol free) 25mg every 6 hours as needed Tylenol PM Unisom, no Gelcaps  Leg Cramps: Tums MagGel  Nausea/Vomiting:  Bonine Dramamine Emetrol Ginger extract Sea bands Meclizine  Nausea medication to take during pregnancy:  Unisom (doxylamine succinate 25 mg tablets) Take one tablet daily at bedtime. If symptoms are not adequately controlled, the dose can be increased to a maximum recommended dose of two tablets daily (1/2 tablet in the morning, 1/2 tablet mid-afternoon and one at bedtime). Vitamin B6 100mg tablets. Take one tablet twice a day (up to 200 mg per day).  Skin Rashes: Aveeno products Benadryl cream or 25mg every 6 hours as needed Calamine Lotion 1% cortisone cream  Yeast infection: Gyne-lotrimin 7 Monistat 7  Gum/tooth pain: Anbesol  **If taking multiple medications, please check labels to avoid duplicating the same active ingredients **take  medication as directed on the label ** Do not exceed 4000 mg of tylenol in 24 hours **Do not take medications that contain aspirin or ibuprofen      [facility] Providers          [facility]  [address], [place], [place] [address]  [phone]  [facility]  [address], [place], [place] [address]  [phone]  [facility]  [address], [place], [place] [address]  [phone]  [facility] [address], [place], [place] [address] [phone]  [facility] [place]  [address], [place], [place] [address]  [phone]  [facility]  [address] floor), [place], [place] [address]  [phone]  [facility]  [address], [place], [place] [address]  [phone]  [facility]  [address], [place], [place] [address]  [phone]  [facility]  [address], [place], [place] [address]  [phone]  [facility]  [address], [place], [place] [address]  [phone]  [facility]  [address], [place], [place] [address]  [phone]  [facility]  [address], [place], [place] [address]  [phone]  [facility]   [address], [place], [place] [address]  [phone]  [facility]  [address], [place], [place] [address]   [phone]  [facility] [address], , [place] [address] [phone]  [facility]  [address], [place], [place] [address]  [phone]         

## 2022-08-14 NOTE — MAU Note (Signed)
Pt says she did HPT- last week- positive  Tonight feels upper abd pain and radiates to her back - started 9pm  6/10

## 2022-09-15 ENCOUNTER — Other Ambulatory Visit: Payer: Self-pay | Admitting: Obstetrics & Gynecology

## 2022-09-15 DIAGNOSIS — N926 Irregular menstruation, unspecified: Secondary | ICD-10-CM

## 2022-09-15 NOTE — Progress Notes (Signed)
Ob US placed

## 2022-09-23 ENCOUNTER — Other Ambulatory Visit: Payer: Medicaid Other

## 2022-09-29 ENCOUNTER — Other Ambulatory Visit (HOSPITAL_COMMUNITY)
Admission: RE | Admit: 2022-09-29 | Discharge: 2022-09-29 | Disposition: A | Discharge status: Home / Self Care | Payer: Medicaid Other | Source: Ambulatory Visit | Attending: Obstetrics and Gynecology | Admitting: Obstetrics and Gynecology

## 2022-09-29 ENCOUNTER — Encounter: Payer: Medicaid Other | Admitting: Obstetrics and Gynecology

## 2022-09-29 ENCOUNTER — Ambulatory Visit (INDEPENDENT_AMBULATORY_CARE_PROVIDER_SITE_OTHER): Payer: Medicaid Other | Admitting: Licensed Clinical Social Worker

## 2022-09-29 ENCOUNTER — Ambulatory Visit (INDEPENDENT_AMBULATORY_CARE_PROVIDER_SITE_OTHER): Payer: Medicaid Other | Admitting: Obstetrics and Gynecology

## 2022-09-29 ENCOUNTER — Ambulatory Visit (INDEPENDENT_AMBULATORY_CARE_PROVIDER_SITE_OTHER): Payer: Medicaid Other

## 2022-09-29 ENCOUNTER — Ambulatory Visit (INDEPENDENT_AMBULATORY_CARE_PROVIDER_SITE_OTHER): Payer: Medicaid Other | Admitting: *Deleted

## 2022-09-29 ENCOUNTER — Institutional Professional Consult (permissible substitution): Payer: Self-pay | Admitting: Licensed Clinical Social Worker

## 2022-09-29 VITALS — BP 120/75 | HR 78 | Wt 160.3 lb

## 2022-09-29 DIAGNOSIS — Z1339 Encounter for screening examination for other mental health and behavioral disorders: Secondary | ICD-10-CM

## 2022-09-29 DIAGNOSIS — Z3A12 12 weeks gestation of pregnancy: Secondary | ICD-10-CM

## 2022-09-29 DIAGNOSIS — O3680X0 Pregnancy with inconclusive fetal viability, not applicable or unspecified: Secondary | ICD-10-CM

## 2022-09-29 DIAGNOSIS — Z3481 Encounter for supervision of other normal pregnancy, first trimester: Secondary | ICD-10-CM | POA: Diagnosis not present

## 2022-09-29 DIAGNOSIS — Z3480 Encounter for supervision of other normal pregnancy, unspecified trimester: Secondary | ICD-10-CM | POA: Insufficient documentation

## 2022-09-29 DIAGNOSIS — O99511 Diseases of the respiratory system complicating pregnancy, first trimester: Secondary | ICD-10-CM

## 2022-09-29 DIAGNOSIS — J45909 Unspecified asthma, uncomplicated: Secondary | ICD-10-CM

## 2022-09-29 MED ORDER — BLOOD PRESSURE KIT DEVI
1.0000 | 0 refills | Status: DC
Start: 2022-09-29 — End: 2023-04-11

## 2022-09-29 NOTE — Addendum Note (Signed)
Addended by: Maretta Bees on: 09/29/2022 01:57 PM   Modules accepted: Orders

## 2022-09-29 NOTE — Progress Notes (Signed)
INITIAL PRENATAL VISIT NOTE  Subjective:  Cassidy Meyer is a 27 y.o. G3P1011 at [redacted]w[redacted]d by LMP being seen today for her initial prenatal visit. She has an obstetric history significant for SVD. She has a medical history significant for asthma.  Patient reports no complaints.  Denies leaking of fluid.    Past Medical History:  Diagnosis Date   Asthma    Bronchitis    Headache     Past Surgical History:  Procedure Laterality Date   APPENDECTOMY     HERNIA REPAIR      OB History  Gravida Para Term Preterm AB Living  3 1 1  0 1 1  SAB IAB Ectopic Multiple Live Births  0 1 0 0 1    # Outcome Date GA Lbr Len/2nd Weight Sex Type Anes PTL Lv  3 Current           2 Term 09/02/17 [redacted]w[redacted]d 26:01 / 01:33 8 lb 6.4 oz (3.81 kg) F Vag-Spont EPI  LIV     Birth Comments: wnl  1 IAB 2015            Social History   Socioeconomic History   Marital status: Single    Spouse name: Not on file   Number of children: Not on file   Years of education: Not on file   Highest education level: Not on file  Occupational History   Occupation: Event organiser: [employer]  Tobacco Use   Smoking status: Former    Current packs/day: 0.25    Average packs/day: 0.3 packs/day for 7.0 years (1.8 ttl pk-yrs)    Types: Cigarettes   Smokeless tobacco: Never  Vaping Use   Vaping status: Never Used  Substance and Sexual Activity   Alcohol use: No   Drug use: Not Currently   Sexual activity: Yes    Birth control/protection: None  Other Topics Concern   Not on file  Social History Narrative   Not on file   Social Determinants of Health   Financial Resource Strain: Not on file  Food Insecurity: No Food Insecurity (08/29/2017)   Hunger Vital Sign    Worried About Running Out of Food in the Last Year: Never true    Ran Out of Food in the Last Year: Never true  Transportation Needs: No Transportation Needs (08/29/2017)   PRAPARE - Administrator, Civil Service (Medical): No    Lack of  Transportation (Non-Medical): No  Physical Activity: Inactive (08/29/2017)   Exercise Vital Sign    Days of Exercise per Week: 0 days    Minutes of Exercise per Session: 0 min  Stress: No Stress Concern Present (08/29/2017)   [employer] of Occupational Health - Occupational Stress Questionnaire    Feeling of Stress : Not at all  Social Connections: Moderately Integrated (08/29/2017)   Social Connection and Isolation Panel [NHANES]    Frequency of Communication with Friends and Family: More than three times a week    Frequency of Social Gatherings with Friends and Family: Once a week    Attends Religious Services: 1 to 4 times per year    Active Member of [employer] or Organizations: No    Attends Banker Meetings: Never    Marital Status: Living with partner    Family History  Problem Relation Age of Onset   Hypertension Mother    Epilepsy Mother    Cancer Maternal Aunt        bladder  Dementia Maternal Grandmother    Diabetes Neg Hx      Current Outpatient Medications:    Blood Pressure Monitoring (BLOOD PRESSURE KIT) DEVI, 1 Device by Does not apply route once a week., Disp: 1 each, Rfl: 0   Prenatal Multivit-Min-Fe-FA (PRENATAL VITAMINS PO), Take 1 tablet by mouth daily. , Disp: , Rfl:   No Known Allergies  Review of Systems: Negative except for what is mentioned in HPI.  Objective:  There were no vitals filed for this visit.  Fetal Status:           Physical Exam: LMP 07/06/2022  CONSTITUTIONAL: Well-developed, well-nourished female in no acute distress.  NEUROLOGIC: Alert and oriented to person, place, and time. Normal reflexes, muscle tone coordination. No cranial nerve deficit noted. PSYCHIATRIC: Normal mood and affect. Normal behavior. Normal judgment and thought content. SKIN: Skin is warm and dry. No rash noted. Not diaphoretic. No erythema. No pallor. HENT:  Normocephalic, atraumatic, External right and left ear normal. Oropharynx is clear and  moist EYES: Conjunctivae and EOM are normal.  NECK: Normal range of motion, supple, no masses CARDIOVASCULAR: Normal heart rate noted, regular rhythm RESPIRATORY: Effort and breath sounds normal, no problems with respiration noted BREASTS: deferred ABDOMEN: Soft, nontender, nondistended, gravid. GU: normal appearing external female genitalia, multiparous, normal appearing cervix, scant white discharge in vagina, no lesions noted, pap taken without incident Bimanual: 12 weeks sized uterus, no adnexal tenderness or palpable lesions noted MUSCULOSKELETAL: Normal range of motion. EXT:  No edema and no tenderness. 2+ distal pulses.   Assessment and Plan:  Pregnancy: G3P1011 at 109w1d by LMP  1. [redacted] weeks gestation of pregnancy   2. Asthma affecting pregnancy, antepartum   3. Supervision of other normal pregnancy, antepartum Continue routine prenatal care - [place] MFM OB DETAIL +14 WK; Future   Preterm labor symptoms and general obstetric precautions including but not limited to vaginal bleeding, contractions, leaking of fluid and fetal movement were reviewed in detail with the patient.  Please refer to After Visit Summary for other counseling recommendations.   Return in about 4 weeks (around 10/27/2022) for ROB, in person.  Warden Fillers 09/29/2022 1:09 PM

## 2022-09-29 NOTE — Progress Notes (Signed)
Pt had intake with u/s today.

## 2022-09-29 NOTE — Patient Instructions (Signed)

## 2022-09-29 NOTE — Patient Instructions (Signed)

## 2022-09-29 NOTE — Progress Notes (Signed)
New OB Intake  I connected with Cassidy Meyer  on 09/29/22 at  8:15 AM EDT by In Person Visit and verified that I am speaking with the correct person using two identifiers. Nurse is located at [facility] and pt is located at [place].  I discussed the limitations, risks, security and privacy concerns of performing an evaluation and management service by telephone and the availability of in person appointments. I also discussed with the patient that there may be a patient responsible charge related to this service. The patient expressed understanding and agreed to proceed.  I explained I am completing New OB Intake today. We discussed EDD of 04/12/2023, by Last Menstrual Period. Pt is G3P1011. I reviewed her allergies, medications and Medical/Surgical/OB history.    Patient Active Problem List   Diagnosis Date Noted   Normal labor 09/01/2017   Decreased fetal movement 08/31/2017   Group B streptococcal carriage complicating pregnancy 08/18/2017   Supervision of other normal pregnancy, antepartum 02/14/2017   Asthma affecting pregnancy, antepartum 02/14/2017   Tobacco smoking affecting pregnancy, antepartum 02/14/2017   Migraine 11/14/2015   Depression 04/21/2014   Hearing loss 04/22/2009   ECZEMA 01/06/2009    Concerns addressed today  Delivery Plans Plans to deliver at [facility]. Discussed the nature of our practice with multiple providers including residents and students. Due to the size of the practice, the delivering provider may not be the same as those providing prenatal care.   Patient is interested in water birth. Offered upcoming OB visit with CNM to discuss further.  MyChart/Babyscripts MyChart access verified. I explained pt will have some visits in office and some virtually. Babyscripts instructions given and order placed. Patient verifies receipt of registration text/e-mail. Account successfully created and app downloaded.  Blood Pressure Cuff/Weight Scale Blood pressure  cuff ordered for patient to pick-up from [employer]. Explained after first prenatal appt pt will check weekly and document in Babyscripts. Patient does not have weight scale; patient may purchase if they desire to track weight weekly in Babyscripts.  Anatomy US Explained first scheduled [place] will be around 19 weeks. Anatomy US scheduled for TBD at TBD.   Interested in [place]? If yes, send referral and doula dot phrase.   Is patient a candidate for Babyscripts Optimization? Yes  First visit review I reviewed new OB appt with patient. Explained pt will be seen by Dr. Donavan Foil at first visit. Discussed Avelina Laine genetic screening with patient. Requests Panorama and Horizon.. Routine prenatal labs  collected at today's visit.    Last Pap Diagnosis  Date Value Ref Range Status  02/14/2017   Final   NEGATIVE FOR INTRAEPITHELIAL LESIONS OR MALIGNANCY.    Harrel Lemon, RN 09/29/2022  8:25 AM

## 2022-09-30 LAB — CBC/D/PLT+RPR+RH+ABO+RUBIGG...
Antibody Screen: NEGATIVE
Basophils Absolute: 0.1 10*3/uL (ref 0.0–0.2)
Basos: 1 %
EOS (ABSOLUTE): 0.4 10*3/uL (ref 0.0–0.4)
Eos: 7 %
HCV Ab: NONREACTIVE
HIV Screen 4th Generation wRfx: NONREACTIVE
Hematocrit: 38.2 % (ref 34.0–46.6)
Hemoglobin: 12.6 g/dL (ref 11.1–15.9)
Hepatitis B Surface Ag: NEGATIVE
Immature Grans (Abs): 0 10*3/uL (ref 0.0–0.1)
Immature Granulocytes: 0 %
Lymphocytes Absolute: 1.5 10*3/uL (ref 0.7–3.1)
Lymphs: 27 %
MCH: 27.5 pg (ref 26.6–33.0)
MCHC: 33 g/dL (ref 31.5–35.7)
MCV: 83 fL (ref 79–97)
Monocytes Absolute: 0.4 10*3/uL (ref 0.1–0.9)
Monocytes: 8 %
Neutrophils Absolute: 3.3 10*3/uL (ref 1.4–7.0)
Neutrophils: 57 %
Platelets: 267 10*3/uL (ref 150–450)
RBC: 4.59 x10E6/uL (ref 3.77–5.28)
RDW: 13.4 % (ref 11.7–15.4)
RPR Ser Ql: NONREACTIVE
Rh Factor: POSITIVE
Rubella Antibodies, IGG: 5.66 {index} (ref >0.99)
WBC: 5.7 10*3/uL (ref 3.4–10.8)

## 2022-09-30 LAB — CERVICOVAGINAL ANCILLARY ONLY
Bacterial Vaginitis (gardnerella): POSITIVE — AB
Candida Glabrata: NEGATIVE
Candida Vaginitis: POSITIVE — AB
Chlamydia: NEGATIVE
Comment: NEGATIVE
Comment: NEGATIVE
Comment: NEGATIVE
Comment: NEGATIVE
Comment: NEGATIVE
Comment: NORMAL
Neisseria Gonorrhea: NEGATIVE
Trichomonas: NEGATIVE

## 2022-09-30 LAB — HCV INTERPRETATION

## 2022-10-01 ENCOUNTER — Other Ambulatory Visit: Payer: Self-pay | Admitting: *Deleted

## 2022-10-01 LAB — CYTOLOGY - PAP
Comment: NEGATIVE
Diagnosis: NEGATIVE
High risk HPV: NEGATIVE

## 2022-10-01 LAB — URINE CULTURE, OB REFLEX: Organism ID, Bacteria: NO GROWTH

## 2022-10-01 LAB — CULTURE, OB URINE

## 2022-10-01 MED ORDER — BLOOD PRESSURE KIT DEVI: 1.0000 | 0 refills | Status: DC

## 2022-10-04 ENCOUNTER — Other Ambulatory Visit: Payer: Self-pay

## 2022-10-04 DIAGNOSIS — B379 Candidiasis, unspecified: Secondary | ICD-10-CM

## 2022-10-04 DIAGNOSIS — B9689 Other specified bacterial agents as the cause of diseases classified elsewhere: Secondary | ICD-10-CM

## 2022-10-04 MED ORDER — TERCONAZOLE 0.4 % VA CREA
1.0000 | TOPICAL_CREAM | Freq: Every day | VAGINAL | 0 refills | Status: DC
Start: 2022-10-04 — End: 2022-12-28

## 2022-10-04 MED ORDER — METRONIDAZOLE 500 MG PO TABS
500.0000 mg | ORAL_TABLET | Freq: Two times a day (BID) | ORAL | 0 refills | Status: DC
Start: 2022-10-04 — End: 2022-12-28

## 2022-10-06 NOTE — BH Specialist Note (Signed)
Integrated Behavioral Health Initial In-Person Visit  MRN: [id number] Name: Cassidy Meyer  Number of Integrated Behavioral Health Clinician visits: 1 Session Start time:   1045AM Session End time: 1100AM Total time in minutes: 15 mins [facility] at [facility]   Types of Service: General Behavioral Integrated Care (BHI)  Interpretor:No. Interpretor Name and Language: none   Warm Hand Off Completed.        Subjective: Cassidy Meyer is a 27 y.o. female accompanied by n/a Patient was referred by Dr. Donavan Foil for new ob introduction. Patient reports the following symptoms/concerns: no reported concerns.  Duration of problem: n/; Severity of problem: n/a  Objective: Mood: good and Affect: Appropriate Risk of harm to self or others: No plan to harm self or others  Life Context: Family and Social: Lives with family in [place]  School/Work: n/a Self-Care: rest  Life Changes: new pregnancy  Patient and/or Family's Strengths/Protective Factors: Concrete supports in place (healthy food, safe environments, etc.)  Goals Addressed: Patient will: Take prenatal vitamins  Prioritize rest   Progress towards Goals: Ongoing  Interventions: Interventions utilized: Link to [facility]  Standardized Assessments completed: PHQ 9  Patient and/or Family Response: Pt reports no further concerns.       Gwyndolyn Saxon, LCSW

## 2022-10-16 LAB — HORIZON CUSTOM: REPORT SUMMARY: NEGATIVE

## 2022-10-27 ENCOUNTER — Encounter: Payer: Medicaid Other | Admitting: Obstetrics and Gynecology

## 2022-11-16 ENCOUNTER — Ambulatory Visit: Payer: Medicaid Other | Admitting: *Deleted

## 2022-11-16 ENCOUNTER — Ambulatory Visit: Payer: Medicaid Other | Attending: Obstetrics and Gynecology

## 2022-11-16 ENCOUNTER — Encounter: Payer: Self-pay | Admitting: *Deleted

## 2022-11-16 ENCOUNTER — Other Ambulatory Visit: Payer: Self-pay | Admitting: *Deleted

## 2022-11-16 ENCOUNTER — Other Ambulatory Visit: Payer: Self-pay

## 2022-11-16 VITALS — BP 136/69 | HR 81

## 2022-11-16 DIAGNOSIS — Z3A19 19 weeks gestation of pregnancy: Secondary | ICD-10-CM | POA: Insufficient documentation

## 2022-11-16 DIAGNOSIS — O358XX0 Maternal care for other (suspected) fetal abnormality and damage, not applicable or unspecified: Secondary | ICD-10-CM | POA: Diagnosis not present

## 2022-11-16 DIAGNOSIS — O43192 Other malformation of placenta, second trimester: Secondary | ICD-10-CM

## 2022-11-16 DIAGNOSIS — Z3480 Encounter for supervision of other normal pregnancy, unspecified trimester: Secondary | ICD-10-CM

## 2022-11-16 DIAGNOSIS — Z363 Encounter for antenatal screening for malformations: Secondary | ICD-10-CM | POA: Insufficient documentation

## 2022-11-16 DIAGNOSIS — O43122 Velamentous insertion of umbilical cord, second trimester: Secondary | ICD-10-CM | POA: Diagnosis not present

## 2022-11-17 ENCOUNTER — Telehealth: Payer: Self-pay | Admitting: Obstetrics and Gynecology

## 2022-12-28 ENCOUNTER — Encounter (HOSPITAL_COMMUNITY): Payer: Self-pay | Admitting: Obstetrics and Gynecology

## 2022-12-28 ENCOUNTER — Other Ambulatory Visit: Payer: Self-pay

## 2022-12-28 ENCOUNTER — Inpatient Hospital Stay (HOSPITAL_COMMUNITY)
Admission: AD | Admit: 2022-12-28 | Discharge: 2022-12-28 | Disposition: A | Discharge status: Home / Self Care | Payer: Medicaid Other | Attending: Obstetrics and Gynecology | Admitting: Obstetrics and Gynecology

## 2022-12-28 DIAGNOSIS — R1013 Epigastric pain: Secondary | ICD-10-CM | POA: Diagnosis present

## 2022-12-28 DIAGNOSIS — R102 Pelvic and perineal pain: Secondary | ICD-10-CM

## 2022-12-28 DIAGNOSIS — N949 Unspecified condition associated with female genital organs and menstrual cycle: Secondary | ICD-10-CM | POA: Diagnosis not present

## 2022-12-28 DIAGNOSIS — Z3A25 25 weeks gestation of pregnancy: Secondary | ICD-10-CM

## 2022-12-28 DIAGNOSIS — R12 Heartburn: Secondary | ICD-10-CM | POA: Diagnosis not present

## 2022-12-28 DIAGNOSIS — O26892 Other specified pregnancy related conditions, second trimester: Secondary | ICD-10-CM

## 2022-12-28 DIAGNOSIS — Z3480 Encounter for supervision of other normal pregnancy, unspecified trimester: Secondary | ICD-10-CM

## 2022-12-28 LAB — CBC WITH DIFFERENTIAL/PLATELET
Abs Immature Granulocytes: 0.02 10*3/uL (ref 0.00–0.07)
Basophils Absolute: 0 10*3/uL (ref 0.0–0.1)
Basophils Relative: 0 %
Eosinophils Absolute: 0.3 10*3/uL (ref 0.0–0.5)
Eosinophils Relative: 6 %
HCT: 32.8 % — ABNORMAL LOW (ref 36.0–46.0)
Hemoglobin: 11.2 g/dL — ABNORMAL LOW (ref 12.0–15.0)
Immature Granulocytes: 0 %
Lymphocytes Relative: 25 %
Lymphs Abs: 1.2 10*3/uL (ref 0.7–4.0)
MCH: 28.8 pg (ref 26.0–34.0)
MCHC: 34.1 g/dL (ref 30.0–36.0)
MCV: 84.3 fL (ref 80.0–100.0)
Monocytes Absolute: 0.3 10*3/uL (ref 0.1–1.0)
Monocytes Relative: 7 %
Neutro Abs: 2.9 10*3/uL (ref 1.7–7.7)
Neutrophils Relative %: 62 %
Platelets: 159 10*3/uL (ref 150–400)
RBC: 3.89 MIL/uL (ref 3.87–5.11)
RDW: 13.7 % (ref 11.5–15.5)
WBC: 4.6 10*3/uL (ref 4.0–10.5)
nRBC: 0 % (ref 0.0–0.2)

## 2022-12-28 LAB — URINALYSIS, ROUTINE W REFLEX MICROSCOPIC
Bilirubin Urine: NEGATIVE
Glucose, UA: NEGATIVE mg/dL
Hgb urine dipstick: NEGATIVE
Ketones, ur: NEGATIVE mg/dL
Leukocytes,Ua: NEGATIVE
Nitrite: NEGATIVE
Protein, ur: NEGATIVE mg/dL
Specific Gravity, Urine: 1.02 (ref 1.005–1.030)
pH: 6 (ref 5.0–8.0)

## 2022-12-28 LAB — COMPREHENSIVE METABOLIC PANEL
ALT: 10 U/L (ref 0–44)
AST: 13 U/L — ABNORMAL LOW (ref 15–41)
Albumin: 2.8 g/dL — ABNORMAL LOW (ref 3.5–5.0)
Alkaline Phosphatase: 47 U/L (ref 38–126)
Anion gap: 6 (ref 5–15)
BUN: 5 mg/dL — ABNORMAL LOW (ref 6–20)
CO2: 24 mmol/L (ref 22–32)
Calcium: 8.7 mg/dL — ABNORMAL LOW (ref 8.9–10.3)
Chloride: 106 mmol/L (ref 98–111)
Creatinine, Ser: 0.57 mg/dL (ref 0.44–1.00)
GFR, Estimated: >60 mL/min (ref >60)
Glucose, Bld: 78 mg/dL (ref 70–99)
Potassium: 3.7 mmol/L (ref 3.5–5.1)
Sodium: 136 mmol/L (ref 135–145)
Total Bilirubin: 0.4 mg/dL (ref <1.2)
Total Protein: 6.2 g/dL — ABNORMAL LOW (ref 6.5–8.1)

## 2022-12-28 LAB — WET PREP, GENITAL
Clue Cells Wet Prep HPF POC: NONE SEEN
Sperm: NONE SEEN
Trich, Wet Prep: NONE SEEN
WBC, Wet Prep HPF POC: <10 (ref <10)
Yeast Wet Prep HPF POC: NONE SEEN

## 2022-12-28 MED ORDER — LIDOCAINE VISCOUS HCL 2 % MT SOLN
15.0000 mL | Freq: Once | OROMUCOSAL | Status: AC
  Administered 2022-12-28: 15 mL via ORAL
  Filled 2022-12-28: qty 15

## 2022-12-28 MED ORDER — ACETAMINOPHEN 500 MG PO TABS
1000.0000 mg | ORAL_TABLET | Freq: Once | ORAL | Status: AC
  Administered 2022-12-28: 1000 mg via ORAL
  Filled 2022-12-28: qty 2

## 2022-12-28 MED ORDER — ALUM & MAG HYDROXIDE-SIMETH 200-200-20 MG/5ML PO SUSP
30.0000 mL | Freq: Once | ORAL | Status: AC
  Administered 2022-12-28: 30 mL via ORAL
  Filled 2022-12-28: qty 30

## 2022-12-28 NOTE — Discharge Instructions (Signed)
Round Ligament Massage & Stretches  Massage: Starting at the middle of your pubic bone, trace little circles in a wide U from your pubic bone to your hip bones on both sides.  Then starting just above your pubic bone, press in and down, alternating sides to create a gentle rocking of your uterus back and forth.  Move your hands up the sides of your belly and back down. Do this 3-5 times upon waking and before bed.  Stretches: Get on hands and knees and alternate arching your back deeply while inhaling, and then rounding your back while exhaling. Modified runners lunge:  - Sit on a chair with half of your bottom on the chair and half off.  - Sit up tall, plant your front foot, and stretch your other foot out behind you.  - Breathe deeply for 5 breaths and then do the other side.    PREGNANCY SUPPORT BELT: You are not alone, Seventy-five percent of women have some sort of abdominal or back pain at some point in their pregnancy. Your baby is growing at a fast pace, which means that your whole body is rapidly trying to adjust to the changes. As your uterus grows, your back may start feeling a bit under stress and this can result in back or abdominal pain that can go from mild, and therefore bearable, to severe pains that will not allow you to sit or lay down comfortably, When it comes to dealing with pregnancy-related pains and cramps, some pregnant women usually prefer natural remedies, which the market is filled with nowadays. For example, wearing a pregnancy support belt can help ease and lessen your discomfort and pain. WHAT ARE THE BENEFITS OF WEARING A PREGNANCY SUPPORT BELT? A pregnancy support belt provides support to the lower portion of the belly taking some of the weight of the growing uterus and distributing to the other parts of your body. It is designed make you comfortable and gives you extra support. Over the years, the pregnancy apparel market has been studying the needs and wants of  pregnant women and they have come up with the most comfortable pregnancy support belts that woman could ever ask for. In fact, you will no longer have to wear a stretched-out or bulky pregnancy belt that is visible underneath your clothes and makes you feel even more uncomfortable. Nowadays, a pregnancy support belt is made of comfortable and stretchy materials that will not irritate your skin but will actually make you feel at ease and you will not even notice you are wearing it. They are easy to put on and adjust during the day and can be worn at night for additional support.  BENEFITS: Relives Back pain Relieves Abdominal Muscle and Leg Pain Stabilizes the Pelvic Ring Offers a Cushioned Abdominal Lift Pad Relieves pressure on the Sciatic Nerve Within Minutes WHERE TO GET YOUR PREGNANCY BELT: [facility] [phone] @[address] [place], [place] [address]  [facility]  [address], [place], [place] [address]  [phone]  [facility]  [address] [place], [place] [address]  [phone]  [facility]  [address] , [place] [address]  [phone]  [facility]  [address], [place], [place] [address]  [phone]  

## 2022-12-28 NOTE — MAU Provider Note (Signed)
History     CSN: [id number]  Arrival date and time: 12/28/22 6578   Event Date/Time   First Provider Initiated Contact with Patient 12/28/22 1044      Chief Complaint  Patient presents with   Pelvic Pain   Abdominal Pain   Zaniyha Horse , a  27 y.o. G3P1011 at [redacted]w[redacted]d presents to MAU with complaints of mid-sternal epigastric pain and lower pelvic pain. Patient reports that both pains started over weekend. She reports the epigastric is intermittent and reports as sharp and stabby. Currently rates as 8/10. Denies attempting to relieve symptoms or worsening or alleviating symptoms.   She describes the pelvic pain as a constant achy feeling that is worsened by coughing, walking and changing positions. She states that she tried some stretching without relief. She currently rates this pain a 9/10. She endorses some tenderness on palpation and some white vaginal discharge that haas an odor. She denies vaginal bleeding, leaking of fluid. She was also reporting decreased fetal movement, but since arrival to MAU has improved.          OB History     Gravida  3   Para  1   Term  1   Preterm  0   AB  1   Living  1      SAB  0   IAB  1   Ectopic  0   Multiple  0   Live Births  1           Past Medical History:  Diagnosis Date   Asthma    Bronchitis    Decreased fetal movement 08/31/2017   Headache    Normal labor 09/01/2017    Past Surgical History:  Procedure Laterality Date   APPENDECTOMY     HERNIA REPAIR      Family History  Problem Relation Age of Onset   Hypertension Mother    Epilepsy Mother    Cancer Maternal Aunt        bladder   Dementia Maternal Grandmother    Diabetes Neg Hx     Social History   Tobacco Use   Smoking status: Former    Current packs/day: 0.25    Average packs/day: 0.3 packs/day for 7.0 years (1.8 ttl pk-yrs)    Types: Cigarettes   Smokeless tobacco: Never  Vaping Use   Vaping status: Never Used  Substance Use  Topics   Alcohol use: No   Drug use: Not Currently    Allergies: No Known Allergies  Medications Prior to Admission  Medication Sig Dispense Refill Last Dose   Blood Pressure Monitoring (BLOOD PRESSURE KIT) DEVI 1 Device by Does not apply route once a week. 1 each 0 12/28/2022   Blood Pressure Monitoring (BLOOD PRESSURE KIT) DEVI 1 Device by Does not apply route once a week. 1 each 0 12/28/2022   Prenatal Multivit-Min-Fe-FA (PRENATAL VITAMINS PO) Take 1 tablet by mouth daily.    12/27/2022   metroNIDAZOLE (FLAGYL) 500 MG tablet Take 1 tablet (500 mg total) by mouth 2 (two) times daily. 14 tablet 0    terconazole (TERAZOL 7) 0.4 % vaginal cream Place 1 applicator vaginally at bedtime. 45 g 0     Review of Systems  Constitutional:  Negative for chills, fatigue and fever.  Eyes:  Negative for pain and visual disturbance.  Respiratory:  Negative for apnea, shortness of breath and wheezing.   Cardiovascular:  Negative for chest pain and palpitations.  Gastrointestinal:  Positive for abdominal  pain. Negative for constipation, diarrhea, nausea and vomiting.  Genitourinary:  Positive for pelvic pain, vaginal discharge and vaginal pain. Negative for difficulty urinating, dysuria and vaginal bleeding.  Musculoskeletal:  Negative for back pain.  Neurological:  Negative for seizures, weakness and headaches.  Psychiatric/Behavioral:  Negative for suicidal ideas.    Physical Exam   Blood pressure 129/73, pulse 80, temperature 98.1 F (36.7 C), temperature source Oral, resp. rate 19, height 5\' 4"  (1.626 m), weight 84.7 kg, last menstrual period 07/06/2022, SpO2 100%, unknown if currently breastfeeding.  Physical Exam Vitals and nursing note reviewed.  Constitutional:      General: She is not in acute distress.    Appearance: Normal appearance.  HENT:     Head: Normocephalic.  Pulmonary:     Effort: Pulmonary effort is normal.  Abdominal:     Palpations: Abdomen is soft.     Tenderness:  There is abdominal tenderness in the right lower quadrant, suprapubic area and left lower quadrant. There is guarding. There is no right CVA tenderness or left CVA tenderness.     Comments: Fetal movement present on palpation. Very active   Musculoskeletal:     Cervical back: Normal range of motion.  Skin:    General: Skin is warm and dry.  Neurological:     Mental Status: She is alert and oriented to person, place, and time.  Psychiatric:        Mood and Affect: Mood normal.    FHT: 140 bpm with moderate variability. 10x10 accels present. 1 small variable with quick return to baseline.( Appropriate for gestational age)  Toco: UI with occasional contractions.   MAU Course  Procedures Orders Placed This Encounter  Procedures   Wet prep, genital   Urinalysis, Routine w reflex microscopic -Urine, Clean Catch   CBC with Differential/Platelet   Comprehensive metabolic panel   Meds ordered this encounter  Medications   acetaminophen (TYLENOL) tablet 1,000 mg   AND Linked Order Group    alum & mag hydroxide-simeth (MAALOX/MYLANTA) 200-200-20 MG/5ML suspension 30 mL    lidocaine (XYLOCAINE) 2 % viscous mouth solution 15 mL   Results for orders placed or performed during the hospital encounter of 12/28/22 (from the past 24 hour(s))  Urinalysis, Routine w reflex microscopic -Urine, Clean Catch     Status: None   Collection Time: 12/28/22 10:24 AM  Result Value Ref Range   Color, Urine YELLOW YELLOW   APPearance CLEAR CLEAR   Specific Gravity, Urine 1.020 1.005 - 1.030   pH 6.0 5.0 - 8.0   Glucose, UA NEGATIVE NEGATIVE mg/dL   Hgb urine dipstick NEGATIVE NEGATIVE   Bilirubin Urine NEGATIVE NEGATIVE   Ketones, ur NEGATIVE NEGATIVE mg/dL   Protein, ur NEGATIVE NEGATIVE mg/dL   Nitrite NEGATIVE NEGATIVE   Leukocytes,Ua NEGATIVE NEGATIVE  Wet prep, genital     Status: None   Collection Time: 12/28/22 11:05 AM   Specimen: Vaginal  Result Value Ref Range   Yeast Wet Prep HPF POC NONE  SEEN NONE SEEN   Trich, Wet Prep NONE SEEN NONE SEEN   Clue Cells Wet Prep HPF POC NONE SEEN NONE SEEN   WBC, Wet Prep HPF POC <10 <10   Sperm NONE SEEN   CBC with Differential/Platelet     Status: Abnormal   Collection Time: 12/28/22 11:10 AM  Result Value Ref Range   WBC 4.6 4.0 - 10.5 K/uL   RBC 3.89 3.87 - 5.11 MIL/uL   Hemoglobin 11.2 (L) 12.0 -  15.0 g/dL   HCT 13.0 (L) 86.5 - 78.4 %   MCV 84.3 80.0 - 100.0 fL   MCH 28.8 26.0 - 34.0 pg   MCHC 34.1 30.0 - 36.0 g/dL   RDW 69.6 29.5 - 28.4 %   Platelets 159 150 - 400 K/uL   nRBC 0.0 0.0 - 0.2 %   Neutrophils Relative % 62 %   Neutro Abs 2.9 1.7 - 7.7 K/uL   Lymphocytes Relative 25 %   Lymphs Abs 1.2 0.7 - 4.0 K/uL   Monocytes Relative 7 %   Monocytes Absolute 0.3 0.1 - 1.0 K/uL   Eosinophils Relative 6 %   Eosinophils Absolute 0.3 0.0 - 0.5 K/uL   Basophils Relative 0 %   Basophils Absolute 0.0 0.0 - 0.1 K/uL   Immature Granulocytes 0 %   Abs Immature Granulocytes 0.02 0.00 - 0.07 K/uL  Comprehensive metabolic panel     Status: Abnormal   Collection Time: 12/28/22 11:10 AM  Result Value Ref Range   Sodium 136 135 - 145 mmol/L   Potassium 3.7 3.5 - 5.1 mmol/L   Chloride 106 98 - 111 mmol/L   CO2 24 22 - 32 mmol/L   Glucose, Bld 78 70 - 99 mg/dL   BUN 5 (L) 6 - 20 mg/dL   Creatinine, Ser 1.32 0.44 - 1.00 mg/dL   Calcium 8.7 (L) 8.9 - 10.3 mg/dL   Total Protein 6.2 (L) 6.5 - 8.1 g/dL   Albumin 2.8 (L) 3.5 - 5.0 g/dL   AST 13 (L) 15 - 41 U/L   ALT 10 0 - 44 U/L   Alkaline Phosphatase 47 38 - 126 U/L   Total Bilirubin 0.4 <1.2 mg/dL   GFR, Estimated >44 >01 mL/min   Anion gap 6 5 - 15     MDM - Patient hit fetal clicker greater that 40 times in MAU.  - Wet prep Normal  - White count normal low suspicion for infection.  - UA normal  - Pain likely related to MKS pain or round ligament pain.  - Reports pain relieved with PO Tylenol and GO cocktail  - plan for discharge.   Assessment and Plan   1. Supervision  of other normal pregnancy, antepartum   2. Heartburn   3. [redacted] weeks gestation of pregnancy   4. Round ligament pain   5. Pelvic pain    - Reviewed round ligament pain as a normal discomfort of pregnancy - Recommended Stretches and comfort measures including a maternity support belt.  - Reviewed worsening signs and return precautions  - Reviewed fetal kick count expectations for 2nd trimester.  - FHT appropriate for gestational age at time of discharge.  - Patient discharged home in stable condition and may return to MAU as needed.   Claudette Head, MSN CNM  12/28/2022, 10:44 AM

## 2022-12-28 NOTE — MAU Note (Signed)
Cassidy Meyer is a 27 y.o. at 102w0d here in MAU reporting: she's having upper abdominal pain and pelvic pain that began Saturday.  States abdominal pain is intermittent, sharp and stabbing, the pelvic pain is a constant ache and hurts with walking. Denies VB or LOF.  Reports no FM today, also hasn't eaten this morning. LMP: NA Onset of complaint: Saturday Pain score: 8 abdomen 9 pelvis Vitals:   12/28/22 0943  BP: 121/73  Pulse: 83  Resp: 19  Temp: 98.1 F (36.7 C)  SpO2: 99%     FHT: 140 bpm Lab orders placed from triage:   UA

## 2022-12-29 LAB — GC/CHLAMYDIA PROBE AMP (~~LOC~~) NOT AT ARMC
Chlamydia: NEGATIVE
Comment: NEGATIVE
Comment: NORMAL
Neisseria Gonorrhea: NEGATIVE

## 2023-01-14 ENCOUNTER — Encounter: Payer: Self-pay | Admitting: *Deleted

## 2023-01-14 DIAGNOSIS — O43192 Other malformation of placenta, second trimester: Secondary | ICD-10-CM | POA: Insufficient documentation

## 2023-01-14 DIAGNOSIS — Q758 Other specified congenital malformations of skull and face bones: Secondary | ICD-10-CM | POA: Insufficient documentation

## 2023-01-18 ENCOUNTER — Other Ambulatory Visit: Payer: Self-pay | Admitting: *Deleted

## 2023-01-18 ENCOUNTER — Ambulatory Visit: Payer: Medicaid Other | Attending: Maternal & Fetal Medicine

## 2023-01-18 DIAGNOSIS — O3663X0 Maternal care for excessive fetal growth, third trimester, not applicable or unspecified: Secondary | ICD-10-CM

## 2023-01-18 DIAGNOSIS — O43193 Other malformation of placenta, third trimester: Secondary | ICD-10-CM | POA: Diagnosis not present

## 2023-01-18 DIAGNOSIS — O43192 Other malformation of placenta, second trimester: Secondary | ICD-10-CM | POA: Diagnosis present

## 2023-01-18 DIAGNOSIS — O35AXX0 Maternal care for other (suspected) fetal abnormality and damage, fetal facial anomalies, not applicable or unspecified: Secondary | ICD-10-CM | POA: Diagnosis not present

## 2023-01-18 DIAGNOSIS — Z3A28 28 weeks gestation of pregnancy: Secondary | ICD-10-CM | POA: Diagnosis not present

## 2023-01-19 NOTE — L&D Delivery Note (Signed)
 OB/[facility]N Faculty Practice Delivery Note  Cassidy Meyer is a 28 y.o. G3P1011 s/p SVD at [redacted]w[redacted]d. She was admitted for latent labor, and also w dx of pre-e without SF by mild range BPs upon arrival to MAU and P/C ratio of 0.48.   ROM: 3h 61m with mod MSF fluid GBS Status: neg Maximum Maternal Temperature: 98.1  Labor Progress: [name] was admitted on the morning of 04/09/23 for early labor with mild range BP elevations; she had AROM at 9cm and then progressed to complete.  Delivery Date/Time: March 22nd, 2025 at 1244 Delivery: Called to room and patient was complete. She pushed well approx 10 mins; head delivered LOA. No nuchal cord present. Ant shoulder with a tight fit; [employer] employed; posterior arm delivered after sweeping it forward; body/hips still required firm guiding to be completely delivered. Time from head del to body out: 1 minute. Infant placed on mother's abd; heartrate >100bpm and cry after drying. Cord clamped x 2 after 1-minute delay, and cut by FOB. Cord blood drawn. Placenta delivered spontaneously with gentle cord traction. Fundus firm with massage and Pitocin. Labia, perineum, vagina, and cervix inspected and found to be intact.   Placenta: spont, intact; to L&D Complications: mild SD (time from head-body = 1 minute) Lacerations: none EBL: 313cc Analgesia: epidural  Postpartum Planning [x]  message to sent to schedule follow-up including 1wk BP check [x]  Lasix, K+, M2B, and PPBRx all ordered; will watch BPs  Infant: girl  APGARs 7/9  4390g (9lb 10.9oz)  Arabella Merles, CNM  04/09/2023 1:04 PM

## 2023-01-26 ENCOUNTER — Ambulatory Visit (INDEPENDENT_AMBULATORY_CARE_PROVIDER_SITE_OTHER): Payer: Medicaid Other | Admitting: Obstetrics and Gynecology

## 2023-01-26 ENCOUNTER — Other Ambulatory Visit: Payer: Medicaid Other

## 2023-01-26 VITALS — BP 115/71 | HR 96 | Wt 200.0 lb

## 2023-01-26 DIAGNOSIS — Z3A29 29 weeks gestation of pregnancy: Secondary | ICD-10-CM

## 2023-01-26 DIAGNOSIS — Z3480 Encounter for supervision of other normal pregnancy, unspecified trimester: Secondary | ICD-10-CM

## 2023-01-26 DIAGNOSIS — O43192 Other malformation of placenta, second trimester: Secondary | ICD-10-CM

## 2023-01-26 DIAGNOSIS — O99519 Diseases of the respiratory system complicating pregnancy, unspecified trimester: Secondary | ICD-10-CM

## 2023-01-26 DIAGNOSIS — O99820 Streptococcus B carrier state complicating pregnancy: Secondary | ICD-10-CM

## 2023-01-26 DIAGNOSIS — Z3483 Encounter for supervision of other normal pregnancy, third trimester: Secondary | ICD-10-CM

## 2023-01-26 DIAGNOSIS — J45909 Unspecified asthma, uncomplicated: Secondary | ICD-10-CM

## 2023-01-26 NOTE — Progress Notes (Addendum)
   PRENATAL VISIT NOTE  Subjective:  Cassidy Meyer is a 28 y.o. G3P1011 at [redacted]w[redacted]d being seen today for ongoing prenatal care.  She is currently monitored for the following issues for this low-risk pregnancy and has Hearing loss; Depression; Migraine; Asthma affecting pregnancy, antepartum; Tobacco smoking affecting pregnancy, antepartum; Group B streptococcal carriage complicating pregnancy; Supervision of other normal pregnancy, antepartum; Marginal insertion of umbilical cord affecting management of mother in second trimester; and Congenital hypoplasia of nasal bone on their problem list.  Patient doing well with no acute concerns today. She reports no complaints.  Contractions: Irritability. Vag. Bleeding: None.  Movement: Present. Denies leaking of fluid.   The following portions of the patient's history were reviewed and updated as appropriate: allergies, current medications, past family history, past medical history, past social history, past surgical history and problem list. Problem list updated.  Objective:   Vitals:   01/26/23 0838  BP: 115/71  Pulse: 96  Weight: 200 lb (90.7 kg)    Fetal Status: Fetal Heart Rate (bpm): 140 Fundal Height: 29 cm Movement: Present     General:  Alert, oriented and cooperative. Patient is in no acute distress.  Skin: Skin is warm and dry. No rash noted.   Cardiovascular: Normal heart rate noted  Respiratory: Normal respiratory effort, no problems with respiration noted  Abdomen: Soft, gravid, appropriate for gestational age.  Pain/Pressure: Present     Pelvic: Cervical exam deferred        Extremities: Normal range of motion.     Mental Status:  Normal mood and affect. Normal behavior. Normal judgment and thought content.   Assessment and Plan:  Pregnancy: G3P1011 at [redacted]w[redacted]d  1. Supervision of other normal pregnancy, antepartum (Primary) Continue routine prenatal care  - Glucose Tolerance, 2 Hours w/1 Hour - RPR - CBC - HIV antibody (with  reflex)  2. [redacted] weeks gestation of pregnancy  - Glucose Tolerance, 2 Hours w/1 Hour - RPR - CBC - HIV antibody (with reflex)  3. Asthma affecting pregnancy, antepartum No current exacerbations  4. Group B streptococcal carriage complicating pregnancy I believe this was from a previous pregnancy, no current GBS positive values noted  5. Marginal insertion of umbilical cord affecting management of mother in second trimester Pt has follow up MFM scan on 03/01/23  Preterm labor symptoms and general obstetric precautions including but not limited to vaginal bleeding, contractions, leaking of fluid and fetal movement were reviewed in detail with the patient.  Please refer to After Visit Summary for other counseling recommendations.   Return in about 2 weeks (around 02/09/2023) for ROB, in person.   Jerilynn Buddle, MD [facility]

## 2023-01-27 LAB — CBC
Hematocrit: 35.5 % (ref 34.0–46.6)
Hemoglobin: 11.6 g/dL (ref 11.1–15.9)
MCH: 28.8 pg (ref 26.6–33.0)
MCHC: 32.7 g/dL (ref 31.5–35.7)
MCV: 88 fL (ref 79–97)
Platelets: 178 10*3/uL (ref 150–450)
RBC: 4.03 x10E6/uL (ref 3.77–5.28)
RDW: 13.2 % (ref 11.7–15.4)
WBC: 7.7 10*3/uL (ref 3.4–10.8)

## 2023-01-27 LAB — GLUCOSE TOLERANCE, 2 HOURS W/ 1HR
Glucose, 1 hour: 136 mg/dL (ref 70–179)
Glucose, 2 hour: 68 mg/dL — ABNORMAL LOW (ref 70–152)
Glucose, Fasting: 75 mg/dL (ref 70–91)

## 2023-01-27 LAB — HIV ANTIBODY (ROUTINE TESTING W REFLEX): HIV Screen 4th Generation wRfx: NONREACTIVE

## 2023-01-27 LAB — RPR: RPR Ser Ql: NONREACTIVE

## 2023-02-09 ENCOUNTER — Ambulatory Visit (INDEPENDENT_AMBULATORY_CARE_PROVIDER_SITE_OTHER): Payer: Medicaid Other | Admitting: Obstetrics and Gynecology

## 2023-02-09 VITALS — BP 135/83 | HR 91 | Wt 208.6 lb

## 2023-02-09 DIAGNOSIS — Z3A31 31 weeks gestation of pregnancy: Secondary | ICD-10-CM

## 2023-02-09 DIAGNOSIS — Z3480 Encounter for supervision of other normal pregnancy, unspecified trimester: Secondary | ICD-10-CM

## 2023-02-09 DIAGNOSIS — O3663X0 Maternal care for excessive fetal growth, third trimester, not applicable or unspecified: Secondary | ICD-10-CM

## 2023-02-09 DIAGNOSIS — O43192 Other malformation of placenta, second trimester: Secondary | ICD-10-CM

## 2023-02-09 NOTE — Patient Instructions (Addendum)
 Considering Waterbirth? Guide for patients at Center for [facility]) Why consider waterbirth? Gentle birth for babies  Less pain medicine used in labor  May allow for passive descent/less pushing  May reduce perineal tears  More mobility and instinctive maternal position changes  Increased maternal relaxation   Is waterbirth safe? What are the risks of infection, drowning or other complications? Infection:  Very low risk (3.7 % for tub vs 4.8% for bed)  7 in 8000 waterbirths with documented infection  Poorly cleaned equipment most common cause  Slightly lower group B strep transmission rate  Drowning  Maternal:  Very low risk  Related to seizures or fainting  Newborn:  Very low risk. No evidence of increased risk of respiratory problems in multiple large studies  Physiological protection from breathing under water  Avoid underwater birth if there are any fetal complications  Once baby's head is out of the water, keep it out.  Birth complication  Some reports of cord trauma, but risk decreased by bringing baby to surface gradually  No evidence of increased risk of shoulder dystocia. Mothers can usually change positions faster in water than in a bed, possibly aiding the maneuvers to free the shoulder.   There are 2 things you MUST do to have a waterbirth with [facility]: Attend a waterbirth class at [employer] [facility] at [facility]   3rd Wednesday of every month from 7-9 pm (virtual during COVID) [employer] at [email or web address] or [email or web address] or by calling [phone] Bring [place] the certificate from the class to your prenatal appointment or send via MyChart Meet with a midwife at 36 weeks* to see if you can still plan a waterbirth and to sign the consent.   *We also recommend that you schedule as many of your prenatal visits with a midwife as possible.    Helpful information: You may want to bring a bathing suit top to the hospital  to wear during labor but this is optional.  All other supplies are provided by the hospital. Please arrive at the hospital with signs of active labor, and do not wait at home until late in labor. It takes 45 min- 1 hour for fetal monitoring, and check in to your room to take place, plus transport and filling of the waterbirth tub.    Things that would prevent you from having a waterbirth: Premature, <37wks  Previous cesarean birth  Presence of thick meconium-stained fluid  Multiple gestation (Twins, triplets, etc.)  Uncontrolled diabetes or gestational diabetes requiring medication  Hypertension diagnosed in pregnancy or preexisting hypertension (gestational hypertension, preeclampsia, or chronic hypertension) Fetal growth restriction (your baby measures less than 10th percentile on ultrasound) Heavy vaginal bleeding  Non-reassuring fetal heart rate  Active infection (MRSA, etc.). Group B Strep is NOT a contraindication for waterbirth.  If your labor has to be induced and induction method requires continuous monitoring of the baby's heart rate  Other risks/issues identified by your obstetrical provider   Please remember that birth is unpredictable. Under certain unforeseeable circumstances your provider may advise against giving birth in the tub. These decisions will be made on a case-by-case basis and with the safety of you and your baby as our highest priority.    Updated 04/22/21    [facility] Pediatric Providers  Central/[place]outheast [place] ([address]) [facility] Manson Passey, MD; Deirdre Priest, MD; Lum Babe, MD; Leveda Anna, MD; McDiarmid, MD; Jerene Bears, MD [address] [place], [place] [address] [phone] Mon-Fri 8:30-12:30, 1:30-5:00  Providers come to  see babies during newborn hospitalization Only accepting infants of Mother's who are seen at [facility] or have siblings seen at   [facility] - Yes; Tricare - Yes   [place]  [facility], MD [address], [place], [place] [address] [phone] Mon, Tue, Thur, Fri 8:30-5:00, Wed 10:00-7:00 (closed 1-2pm daily for lunch) [facility] residents with no insurance.  [place] [employer] only with Medicaid/insurance; Tricare - no  [facility]) - Tim [facility] Aph, MD; Manson Passey, MD; Ave Filter, MD; Luna Fuse, MD; Kennedy Bucker, MD; Florestine Avers, MD; Melchor Amour, MD; Yetta Barre,  MD; Konrad Dolores, MD; Kathlene November, MD; Jenne Campus, MD; Wynetta Emery, MD; Duffy Rhody, MD; Gerre Couch, NP [address], [place], [place] [address] [phone] Mon, Tue, Thur, Fri 8:30-5:30, Wed 9:30-5:30, Sat 8:30-12:30 Only accepting infants of first-time parents or siblings of current patients Hospital discharge coordinator will make follow-up appointment Medicaid - yes; Tricare - yes  [place] [phone]) [facility] Ilean China, MD; Earlene Plater, MD; Jamesetta Orleans, MD; Alvera Novel, MD; Rana Snare, MD; [facility], MD; Shaaron Adler, MD; Hosie Poisson, MD; Mayford Knife, MD [address], [place], [place] [address] [phone] Mon-Fri 8:30-5:00, closed for lunch 12:30-1:30; Sat-Sun 10:00-1:00 Accepting Newborns with commercial insurance only, must call prior to delivery to be accepted into  practice.  Medicaid - no, Tricare - yes   [facility] [address] [place], [place] [address] [phone] or [phone] Mon to Fri 8am to 10pm, Sat 8am to 1pm (virtual only on weekends) Only accepts Medicaid Healthy Blue pts  [facility] ([facility]) - Pediatrics at Elige Radon, MD; Sabino Dick, MD; Quitman Livings, MD; Betha Loa, NP; Claretha Cooper, MD; Lelon Perla, MD [address] [place], [place] [address] [phone] Mon-Fri 8:30-5:30 Medicaid - yes, Tricare - yes  [place] [phone]) [facility] Marcie Mowers, MD [address], [place], [place] [address] [phone] Iona Hansen, Wed Fri 8:30-5:00, Sat  8:30-12:00, Closed Thursdays Accepting siblings of established patients and first time mom's if you call prenatally Medicaid- yes; Tricare - yes  [facility] Lutricia Feil, [place]; Tracie Harrier, MD; Rusty Aus; Scifres, PA; Wynelle Link, MD; Azucena Cecil, MD;  [address] [place], [place] [address] [phone] Mon-Fri 8:30-5:00, closed for lunch 1-2 Only accepting newborns of established patients Medicaid- no; Tricare - yes  [facility] [phone]) [facility] Morene Crocker, MD; [address], [place], [place] [address] [phone] Mon-Fri 8:00-5:00 Medicaid - No; Tricare - Yes  [facility], [place]; [place], [place] [address], [place], [place] [address] [phone] Mon-Fri 8:00-5:00 Medicaid - No, Tricare - Yes  [facility] Cardell Peach, MD; Nash Dimmer, MD; [place], [place] [address] [place], [place] [address] [phone]  Mon-Fri 8:00-5:00 Medicaid - No; Tricare - Yes  [facility] [address], [place], [place] [address] [phone] Mon-Fri 8:30-5:00 (lunch 12:00-1:00) Medicaid -Yes; Tricare - Yes  [facility] [place], MD [address] [place], [place], [place] [address] [phone] Mon-Fri 8:00-5:00 Seeing newborns of current patients only. No new patients Medicaid - No, Tricare - yes  Nature conservation officer at [place] [address] MD [address] [place], [place] [address] [phone] Mon-Fri 8:00-5:00 Medicaid -yes as secondary coverage only; Tricare - yes  [facility] [place], [place]; [place], [place]; Avis Epley, MD; Vonna Kotyk, MD; Clance Boll, MD; [place], [place]; [place], NP; Vaughan Basta, MD; [place], MD [address] [place], [place] [address] [phone] Mon-Fri 8:30-5:00, Sat 9:00-11:00 Accepts commercial insurance  ONLY. Offers free prenatal information sessions for families. Medicaid - No, Tricare - Call first  [facility], MD; [place], [place]; [place], [place]; [place], [place] [address] [place] [address] [phone] Mon-Fri 7:30-5:30 Medicaid - Yes; Ailene Rud yes  [place] [phone] & [phone])  [facility], MD [address], [place], [place] [address] [phone] Mon-Thur 8:00-6:00, closed for lunch 12-2, closed Fridays Medicaid - yes; Tricare - no  [facility] Dareen Piano, NP; Cyndia Bent, MD; [place], [place]; [place], [place] [address], [place], [place] [address] [phone] Mon-Fri 7:30-4:30 Medicaid - yes, Tricare - yes  [facility]  Juanito Doom, MD; Janene Harvey, NP; Vonita Moss, MD; Donn Pierini, NP [address], [place], [place] [address] [phone] Mon-Fri 8:30-5:00, closed for lunch 1-2, Sat 8:30-12:00 - sick visits only Providers come to see babies at [facility] Only accepting newborns of siblings and first time parents ONLY if who have met with office prior to delivery Medicaid -Yes; Tricare - yes  [facility] - [place], [place]; Spero Geralds, NP; Earlene Plater, MD; Lucretia Roers, MD:  [address], [place], [place] [address] [phone] Mon- Fri 8:00-5:00, Sat 9:00-12:00 - sick visits only Accepting siblings of established patients and first time mom/baby Medicaid - Yes; Tricare - yes Patients must have vaccinations (baby vaccines)  [place] [phone] & [phone])  Adult nurse HealthCare at [facility] [address] [place], [place] [address] [phone] Mon-Fri 8:00-5:00 Medicaid - no; Tricare - yes  [facility] [place], MD; [place], [place]; [place], [place] [address], [place], [place] [address] [phone] Mon-Fri 8:00-5:00 Medicaid- yes; Tricare - yes  [facility] - Ardeen Jourdain, MD; Yetta Barre, NP; [place], [place] [address] [place], [place], [place]  [address] [phone] Mon-Fri 8:00-5:00 Medicaid - Yes; Tricare - yes  [address] [place] [phone])  [facility] [place], [place]; [place], [place]; Eddie Candle, MD; Normand Sloop, MD; [place], NP; [place], DO; [place], [place]; Constance Goltz, MD; Ruthann Cancer, MD; Vear Clock, MD; [place], [place]; [place], [place]; [place], [place] [address]93 [facility] [address], [place], [place] [address] [phone] Mon-Fri 8:30-5:00, Sat 9:00-12:00 - sick only Please register online [email or web address] then schedule online or call office Medicaid-Yes; Tricare -yes  [facility] Premier  [place], MD; Romualdo Bolk, MD; Maybee, MD; [place], NP; [place], [place]; Antonietta Barcelona, MD; Mayford Knife, NP; Shelva Majestic, MD [address], [place], [place] [address] [phone] Mon-Fri 8:00-5:30, Sat&Sun by appointment (phones open at 8:30) Medicaid - Yes; Tricare - yes  [place] [phone] & [phone]) [facility] Mariel Aloe; [place], MD; Roger [place]ter, MD; Arvilla Market, NP; [place], DO [address], [place], [place] [address] [phone] M-F 8:00 - 5:15, Sat/Sun 9-12 sick visits only Medicaid - No; Tricare - yes  [facility]  Sparkman, PA-C; Rolla, PA-C; [place], DO; [place]an[place], PA-C; Protection, PA-C; Roselyn Bering, MD [address], [place], [place] [address] [phone] Mon-Thur 8:00-7:00, Fri 8:00-5:00 Accepting Medicaid for 13 and under only   [facility] Family Medicine at [place] [facility] [place]) [place], [place]; List, FNP; Berneda Rose, MD; Luther Redo, PA-C; Lavonia Drafts, MD; Kellie Simmering, FNP; Genevie Cheshire, FNP; Evaristo Bury, MD; Berneda Rose, MD [phone] [address], [place], [place] [address] [phone] Mon-Fri 8:30-5:30 Medicaid - Yes; Tricare - yes  [facility] Pediatrics - [address]  [place], [place]; Whitney Post,  MD; Hennie Duos, MD; Wynne Dust, MD; Fifty[place]Six, NP [address], [place], [place] [address] [phone] Mon-Thur 8:00-5:30, Fri 8:00-5:00, Sat 9:00-12:00 Medicaid -  yes, Tricare - yes  [place] [phone])  [facility], [place]; Lenise Arena, MD; [place], [place] [address], [place], [place] [address] [phone] Mon-Fri 8:00-5:00, closed for lunch 12-1 Medicaid - No; Tricare - yes  Nature conservation officer at [facility], MD [address] [place], [place] [address] [phone] Mon-Fri 8:00-5:00 Medicaid - No; Tricare - yes  [facility], MD; Tami Ribas, MD; Mariam Dollar, MD; Yetta Barre, MD [address] [facility], [place], [place] [address] [phone] Mon-Fri 8:00-5:00 Medicaid- Yes; Tricare - yes  [place] [phone])  Adult nurse HealthCare at [facility] [place], [place]; [place], MD [address] [place] [address] [place], [place], [place] [address] [phone] Mon-Fri 8:00-5:00 Medicaid - No; Tricare - yes  [facility] Medicine - Whitney Post - CPNP [address] [place], [place], [place] [address] [phone] Mon-Weds 8:00-6:00, Thurs-Fri 8:00-5:00, Sat 9:00-12:00 Medicaid - yes; Tricare - yes   [facility] Katharina Caper, MD; [place], [place] [address] [place], [place] [address] [phone] Mon-Fri 8:00-5:00 Medicaid - yes; Tricare - yes  [facility] Providers  [facility] [address] [place], [place] [address] [phone] Sheral Flow: 8am -8pm, Tues, Weds: 8am - 5pm; Fri: 8-1 Medicaid - Yes; Tricare - yes  [facility] Rachel Bo, MD; Laural Benes, MD; Anner Crete, MD; [place], [place]; [place], [place] [address] [place], [place] [address] [phone] M-F 8:30 - 5:00 Medicaid - Call office; Tricare -yes  [facility] Edson Snowball, MD; Shanon Rosser, MD, Chelsea Primus, MD; Shirlyn Goltz, PNP; Wardell Heath, NP [phone] [address] [place], [place] [address] [phone] M-F 8:30 - 5:00, Sat/Sun 8:30 - 12:30 (sick visits) Medicaid - Call office; Tricare -yes  [facility] Melvyn Neth, MD; Karl Luke, PNP; Princess Bruins, MD; [place], [place];  [place], NP; Cynda Familia [address], [place], [place] [address] [phone] M-F 8:30 - 5:00 Medicaid - Call office; Tricare - yes  [facility] Jesusita Oka, MD; Dierdre Highman, MD; Earnest Conroy, MD; Timothy Lasso, MD; [place], MD [phone] [address], [place], [place] [address] [phone] M-Thur: 8:00 - 5:00; Fri: 8:00 - 4:00 Medicaid - yes; Tricare - yes  [facility] [address] [place], [place] [address] [phone] M-F: 8:30- 5:00, closed for lunch 12:30 - 1:00 Medicaid - yes; Tricare -yes  [facility] [address], [place], [place] [address] [phone] M-F 8:00 - 5:00 Medicaid - yes; Tricare - yes  [facility] [place], DO; [place], DO; [place], NP [address] [place], [place] [address] [phone] M-F 8:00 - 5:00, Closed 12-1 for lunch Medicaid - Call; Tricare - yes  [facility] Pediatrics Meredith Mody, MD [address], [place], [place] [address] [phone] M-F: 8:00-5:00, Sat: 8:00 - noon Medicaid - call; Tricare -yes  [facility] Pediatric Providers  [facility] [place], [place] [address] [place], [place], [place] [address] [phone] M-W: 8:00-5:00, Thur: 8:00 - 7:00, Fri: 8:00 - noon Medicaid - yes; Tricare - yes  [facility] - Quay Burow, FNP [address], [place], [place] [address] [phone] M-F 8:00 - 5:00, Closed for lunch 12-1 Medicaid - yes; Tricare - yes  [facility] Pediatric Providers  [facility] Primary Care at [place]vy [place], [place], Roe, MD, [place], FNP-C [address], [place]  [address], [place], [place] [address] [phone] M-T 8:00-5:00, Wed-Fri 7:00-6:00 Medicaid - Yes; Tricare -yes  [facility] at [facility], DO; [address], [place], [place] [address] [phone] M-F 8:00 - 5:00, closed for lunch 12-1 Medicaid - Yes; Tricare - yes  [facility] and  Internal Medicine  Zachery Dauer, MD; Gladstone Lighter, MD; Collie Siad, MD; Freda Jackson, MD; Rich Number, MD; Darryl Nestle, MD; Melinda Crutch, MD, Audria Nine, MD; Tawanna Cooler, MD; Steffanie Dunn, MD; Byrd Hesselbach, MD; Lucretia Roers, MD [address], [place], [place] [address] [phone] M-F 8:00-5:00 Medicaid - yes; Tricare - yes  [facility], MD (speaks [place] and Hindi) [address] [place], [place] [address] [phone] M-F: 8:30 - 5:00, closed 12:30 - 1 for lunch Medicaid - Yes; Tricare -yes  [facility] Pediatric Providers  [facility] Shanda Bumps, MD; Chanetta Marshall, MD; Laurell Josephs, MD [address], [place], [place] [address] [phone] M-Th: 8:00 - 5:30, Fri: 8:00 - 12:00 Medicaid - yes; Tricare - yes  [facility] at [facility], NP; Thora Lance, MD; Orrin Brigham, MD [phone] [address] [place], [place] [address] [phone] M-F: 8:00 - 5:00 Medicaid - yes; Tricare - yes  [facility] [place], NP; Orson Slick, NP; Salley Scarlet, NP; Linton Flemings, MD; Mayford Knife, MD, [place], NP, Emelda Fear, MD; Nida Boatman [address] [place], [place] [address] [phone] M-F: 8:30 - 5:30p Medicaid - yes; Tricare - yes Other locations available as well  [facility], MD; Andrey Campanile, MD; Neville Route, PA-C [address] [place], [place] [address] [phone] M-W: 8:00am - 7:00pm, Thurs: 8:00am - 8:00pm; Fri: 8:00am - 5:00pm, closed daily from 12-1 for lunch Medicaid - yes; Tricare - yes  [facility] Pediatric Providers  [facility] at Levin Erp, MD; Aggie Cosier, FNP; Bland Span, MD; Tristan Schroeder, MD; [place], PNP; Alesia Banda; [place], [place]; Julian Reil, MD;  [address] [place], [place] [address] [phone] Judie Petit - Caleen Essex: 8am - 5pm, Sat 9-noon Medicaid - Yes; Tricare -yes  [facility] at Jaclynn Guarneri, MD; Yetta Barre, FNP; Lilian Kapur, MD; Mariam Dollar, MD [address] [place],  [place] [phone] M-F 8:00 - 5:00 Medicaid - call; Tricare - yes  [facility]- Cruz Condon, MD; [place], [place]; Delora Fuel, MD; Dareen Piano, MD; Trudee Grip, MD; Kizzie Ide, MD; Zebedee Iba; Birdena Crandall, MD; Hinton Dyer, MD; [place], MD [address] [place], [place] [address] [phone] M-F 8:00am - 5:00pm; Sat. 9:00 - 11:00 Medicaid - yes; Tricare - yes  [facility] at [facility], MD [address] [place], [place] [address] [phone] M-F 8:00 - 5:00 Medicaid - [place] Medicaid only; Tricare - yes  [facility] Pediatrics - Illene Bolus, MD; Earlene Plater, [place]; Kenyon Ana, MD [address] [place] [address] [place], [place] [address] [phone] M-F 8:00 - 5:00 Medicaid - yes; Tricare - yes  [facility] [address] Pediatrics - Lind Covert, MD; Manson Passey, MD, [facility], MD, [place], MD; Sierra Brooks, MD; Katrinka Blazing, MD; [address] [place], [place] [address] [phone] M-F: 8-5 Medicaid - yes; Tricare - yes  [facility] - Henrietta Hoover, [place]; [place], MD; [address], [place], [place] [address] [phone] M-F 8-5 Medicaid - yes; Tricare - yes  [address] [place] Darrol Poke, MD; Tami Ribas, MD; [place], MD; [place], DNP; Heath, PNP [address], [place], [place] [address] [phone] M-F 8-5 Medicaid - yes; Tricare - yes  [facility] and Pediatrics Delories Heinz, MD; Lorn Junes, PA-C; Ala Bent, MD  [address], [place], [place] [address] [phone] M-F 7am - 5 pm Medicaid - call; Tricare - yes  [facility] [place], PNP; Fredia Beets, MD; Roxan Hockey, MD [address] [place], [place] [address] [phone] M-F 8-5 Medicaid - yes; Tricare - yes  [facility] Kae Heller, MD; Sheliah Hatch, MD; Mayford Knife, FNP; Shon Baton, FNP; Tyron Russell, FNP; Ishmael Holter; [facility] - FNP [address], [place], [place]  [address] [phone] M-F 8-5 Medicaid- yes; Tricare - yes  [facility] - Betsy Coder, [place] and Chalmers Guest, MD; Terrial Rhodes, MD; Hulda Humphrey, MD; Roseanne Reno, MD; [place], [place]; Ala Dach, MD; Fredia Beets, MD; Dimple Casey, MD [address] [place], [place] [address] [phone] M-F: 8-5, Sat: 9-4, Sun 9-12 Medicaid - yes; Tricare - yes  [facility] [place], PNP; Earlene Plater, PNP [address] [place], [place] [address] [phone] M-F 8 - 5, closed 12-1 for lunch Medicaid - yes; Tricare - yes  [facility]s Kathyrn Lass, MD; Hal Neer, MD; Dimple Casey, MD; [place], DO [address], [place], [place] [address] [phone] M-F 8- 5:30 Medicaid - yes; Tricare - yes  [facility] Pediatrics - Biagio Quint, MD; Rosalia Hammers, MD; Gwenith Daily, MD [address] [place], [place] [address] [phone] Judie Petit: Nicholas Lose; Tues-Fri: 8-5; Sat: 9-12 Medicaid - yes; Tricare - yes  [facility] - Bobbye Morton, MD; Daphane Shepherd, MD; Chestine Spore, MD; Haskell Riling, MD; Kate Sable, MD [address], [place], [place] [address] [phone] Judie PetitMarland Kitchen Nicholas LoseFrancee Nodal: 8-5; Sat: 8:30-12:30 Medicaid - yes; Tricare - yes  [facility]lena Heckle [facility] Pediatrics - Beckey Rutter, MD; [place], [place] [address] [place], [place] [address] [phone] Mon-Fri: 8-5 Medicaid - yes; Tricare - yes  [facility]s - [place], CPNP; [place], [place]; Dimple Casey, MD; Alisa Graff, MD; Cephus Shelling, MD; [address] [place], [place] [address] [phone] M-F: 8-5, closed 1-2 for lunch Medicaid - yes; Tricare - yes  Darnelle Bos [facility] - [facility] [place], [place]; [place], [place]; Katrinka Blazing, MD; [place], CPNP; [place], [place]; [place], MD; Earlene Plater, MD [address], [place], [place]  [address] [phone] M-Thurs: Nicholas Lose; Fri: 8-6; Sat: 9-12; Sun 2-4 Medicaid - yes; Tricare - yes  Darnelle [facility] Georgeanna Lea, MD; Evette Cristal, MD; Shea Stakes, FNP; Earney Mallet, DO; [address], [place], [place] [address] [phone] M-F: 8-5 Medicaid - yes; Tricare - yes  [facility] Pediatric Providers  [facility] - Family Medicine -Collene Mares, MD; [place], NP [address], [place], [place] [address] [phone] M - Fri: 8am - 5pm, closed for lunch 12-1 Medicaid - Yes; Tricare - yes  [facility] Elinor Parkinson, MD; Victory Dakin, MD; [place], DO; [place], MD;Hall, PA; Clent Ridges, [place]; Orvan Falconer, NP [phone] [address] [place], [place] [address] [phone] M-F 8:00 - 5:00, Sat 8:00 - 11:30 Medicaid - yes; Tricare - yes  [facility] Welton Flakes, MD; [place], MD, [address] MD, [place], MD, [place], MD; [place], NP; [place], [place];  [address], [place], [place] [address] [phone] M-F 8:10am - 5:00pm Medicaid - yes; Tricare - yes  [facility] Eustaquio Boyden, MD; [place], NP [address] [place], [place] [address] [phone] M-F 8:00 - 5:00 Medicaid - [place] Medicaid only; Tricare - yes  [facility] - [address] [place], MD; [place],  NP [address] [place], [place], [place] [address] [phone] M-F 8:00 - 5:00; Closed for lunch 12 - 1:00 Medicaid - yes; Tricare - yes  [facility] Belva Crome, MD; Jonita Albee, FNP [address] [place], [place] [address] [phone] Mon 9-5; Tues/Wed 10-5; Thurs 8:30-5; Fri: 8-12:30 Medicaid - yes; Tricare - yes  [facility] Pediatric Providers  [facility]  [place], MD; [place], [place] [address] [place], [place] [address] [phone] phone [phone] fax M-F 7:15 - 4:30 Medicaid - yes; Tricare - yes  [facility] Karilyn Cota, MD;  [place], DO [address] [place], [place] [address] [phone] M-Fri: 8:30 - 5:00, closed for lunch everyday noon - 1pm Medicaid - Yes; Tricare - yes  [facility] Burd[place], MD; Reuel Boom, MD; Dimas Aguas, MD; Neita Carp, MD; [place], [place]; Bonnita Nasuti, [place]; [place], [place]; [place], [place]; [place], [place] [address] [place], [place] [address] [phone] M-Thurs: 7:30am - 7:00pm; Friday 7:30am - 4pm; Sat: 8:00 - 1:00 Medicaid - Yes; Tricare - yes  [place] Nor[facility] Morton, MD; Conni Elliot, MD; Carroll Kinds, MD; [place], DO [address], [place], [place] [address] [phone] M-Thur: 8:00 - 5:00, Fri: 8:00 - Noon Medicaid - yes; Tricare - yes No [place] [employer]  [place] [facility] [place], MD; Nadine Counts, DO; [place], NP; Daphine Deutscher, NP; Lequita Halt, NP; Ell[place]e Sia, NP; Reginia Forts, NP; Darlyn Read, MD; [place], [place] [address] [place], [place] [address] [phone] M-F 8:00 - 5:00 Medicaid - yes; Tricare - yes  [facility], FNP-C; [place], FNP-C [address] [place], [place] [address] [phone] M, W, R 8:00-5:00, Tues: 8:00am - 7:00pm; Fri 8:00 - noon Medicaid - Yes; Tricare - yes  [facility], MD [address] [place], [place] [address] [phone]  M-Thurs 8:30-5:30, Fri: 8:30-12:30pm Medicaid - Yes; Tricare - N

## 2023-02-09 NOTE — Progress Notes (Signed)
   PRENATAL VISIT NOTE  Subjective:  Cassidy Meyer is a 28 y.o. G3P1011 at [redacted]w[redacted]d being seen today for ongoing prenatal care.  She is currently monitored for the following issues for this low-risk pregnancy and has Hearing loss; Depression; Migraine; Asthma affecting pregnancy, antepartum; Tobacco smoking affecting pregnancy, antepartum; Supervision of other normal pregnancy, antepartum; Marginal insertion of umbilical cord affecting management of mother in second trimester; and Congenital hypoplasia of nasal bone on their problem list.  Patient reports  not feeling as much movement yesterday and today, denies bleeding, LOF, contractions .  Contractions: Irritability. Vag. Bleeding: None.  Movement: (!) Decreased. Denies leaking of fluid.   The following portions of the patient's history were reviewed and updated as appropriate: allergies, current medications, past family history, past medical history, past social history, past surgical history and problem list.   Objective:   Vitals:   02/09/23 0906  BP: 135/83  Pulse: 91  Weight: 208 lb 9.6 oz (94.6 kg)    Fetal Status: Fetal Heart Rate (bpm): 140 Fundal Height: 31 cm Movement: (!) Decreased     General:  Alert, oriented and cooperative. Patient is in no acute distress.  Skin: Skin is warm and dry. No rash noted.   Cardiovascular: Normal heart rate noted  Respiratory: Normal respiratory effort, no problems with respiration noted  Abdomen: Soft, gravid, appropriate for gestational age.  Pain/Pressure: Present     Pelvic: Cervical exam deferred        Extremities: Normal range of motion.  Edema: None  Mental Status: Normal mood and affect. Normal behavior. Normal judgment and thought content.   Assessment and Plan:  Pregnancy: G3P1011 at [redacted]w[redacted]d 1. Supervision of other normal pregnancy, antepartum (Primary) BP and FHR normal Doing well, feeling regular movement    2. [redacted] weeks gestation of pregnancy NST reactive, feeling and  seeing movement while speaking with her. Strict precautions when to go to hospital regarding movement Continue monitoring fetal kick counts  Waterbirth information given today Peds list given today  3. Marginal insertion of umbilical cord affecting management of mother in second trimester Noted in u/s   4. Excessive fetal growth affecting management of pregnancy in third trimester, single or unspecified fetus 12/31 u/s EFW 92%, AC 89%, afi normal  Follow up scheduled for 2/11   Preterm labor symptoms and general obstetric precautions including but not limited to vaginal bleeding, contractions, leaking of fluid and fetal movement were reviewed in detail with the patient. Please refer to After Visit Summary for other counseling recommendations.   Return in two weeks for routine prenatal   Future Appointments  Date Time Provider Department Center  02/23/2023  8:35 AM Sue Lush, FNP [facility] None  03/01/2023  3:30 PM [facility] US5 [facility]  03/09/2023  8:35 AM Sue Lush, FNP [facility] None    Albertine Grates, FNP

## 2023-02-09 NOTE — Progress Notes (Signed)
Pt presents for ROB visit. Pt c/o decreased fetal movement and and pelvic pain.

## 2023-02-23 ENCOUNTER — Encounter: Payer: Medicaid Other | Admitting: Obstetrics and Gynecology

## 2023-02-25 ENCOUNTER — Telehealth: Payer: Medicaid Other | Admitting: Physician Assistant

## 2023-02-25 DIAGNOSIS — Z3A33 33 weeks gestation of pregnancy: Secondary | ICD-10-CM | POA: Diagnosis not present

## 2023-02-25 DIAGNOSIS — J111 Influenza due to unidentified influenza virus with other respiratory manifestations: Secondary | ICD-10-CM

## 2023-02-25 MED ORDER — OSELTAMIVIR PHOSPHATE 75 MG PO CAPS: 75.0000 mg | ORAL_CAPSULE | Freq: Two times a day (BID) | ORAL | 0 refills | Status: DC

## 2023-02-25 NOTE — Progress Notes (Signed)
 E visit for Flu like symptoms   We are sorry that you are not feeling well.  Here is how we plan to help! Based on what you have shared with me it looks like you may have a respiratory virus that may be influenza.  Influenza or "the flu" is   an infection caused by a respiratory virus. The flu virus is highly contagious and persons who did not receive their yearly flu vaccination may "catch" the flu from close contact.  We have anti-viral medications to treat the viruses that cause this infection. They are not a "cure" and only shorten the course of the infection. These prescriptions are most effective when they are given within the first 2 days of "flu" symptoms. Antiviral medication are indicated if you have a high risk of complications from the flu. You should  also consider an antiviral medication if you are in close contact with someone who is at risk. These medications can help patients avoid complications from the flu  but have side effects that you should know. Possible side effects from Tamiflu or oseltamivir include nausea, vomiting, diarrhea, dizziness, headaches, eye redness, sleep problems or other respiratory symptoms. You should not take Tamiflu if you have an allergy to oseltamivir or any to the ingredients in Tamiflu.  Based upon your symptoms and potential risk factors I have prescribed Oseltamivir (Tamiflu).  It has been sent to your designated pharmacy.  You will take one 75 mg capsule orally twice a day for the next 5 days.  ANYONE WHO HAS FLU SYMPTOMS SHOULD: Stay home. The flu is highly contagious and going out or to work exposes others! Be sure to drink plenty of fluids. Water is fine as well as fruit juices, sodas and electrolyte beverages. You may want to stay away from caffeine or alcohol. If you are nauseated, try taking small sips of liquids. How do you know if you are getting enough fluid? Your urine should be a pale yellow or almost colorless. Get rest. Taking a steamy  shower or using a humidifier may help nasal congestion and ease sore throat pain. Using a saline nasal spray works much the same way. Cough drops, hard candies and sore throat lozenges may ease your cough. Line up a caregiver. Have someone check on you regularly.   GET HELP RIGHT AWAY IF: You cannot keep down liquids or your medications. You become short of breath Your fell like you are going to pass out or loose consciousness. Your symptoms persist after you have completed your treatment plan MAKE SURE YOU  Understand these instructions. Will watch your condition. Will get help right away if you are not doing well or get worse.  Your e-visit answers were reviewed by a board certified advanced clinical practitioner to complete your personal care plan.  Depending on the condition, your plan could have included both over the counter or prescription medications.  If there is a problem please reply  once you have received a response from your provider.  Your safety is important to [place].  If you have drug allergies check your prescription carefully.    You can use MyChart to ask questions about today's visit, request a non-urgent call back, or ask for a work or school excuse for 24 hours related to this e-Visit. If it has been greater than 24 hours you will need to follow up with your provider, or enter a new e-Visit to address those concerns.  You will get an e-mail in the next  two days asking about your experience.  I hope that your e-visit has been valuable and will speed your recovery. Thank you for using e-visits.  I have spent 5 minutes in review of e-visit questionnaire, review and updating patient chart, medical decision making and response to patient.   Margaretann Loveless, PA-C

## 2023-02-25 NOTE — Addendum Note (Signed)
 Addended by: Angelia Kelp on: 02/25/2023 03:03 PM   Modules accepted: Orders

## 2023-03-01 ENCOUNTER — Ambulatory Visit: Payer: Medicaid Other | Attending: Obstetrics and Gynecology | Admitting: Obstetrics and Gynecology

## 2023-03-01 ENCOUNTER — Ambulatory Visit: Payer: Medicaid Other | Attending: Maternal & Fetal Medicine

## 2023-03-01 ENCOUNTER — Other Ambulatory Visit: Payer: Self-pay | Admitting: *Deleted

## 2023-03-01 DIAGNOSIS — O3663X0 Maternal care for excessive fetal growth, third trimester, not applicable or unspecified: Secondary | ICD-10-CM | POA: Insufficient documentation

## 2023-03-01 DIAGNOSIS — O43193 Other malformation of placenta, third trimester: Secondary | ICD-10-CM | POA: Diagnosis not present

## 2023-03-01 DIAGNOSIS — Z3A34 34 weeks gestation of pregnancy: Secondary | ICD-10-CM | POA: Diagnosis not present

## 2023-03-01 DIAGNOSIS — O43199 Other malformation of placenta, unspecified trimester: Secondary | ICD-10-CM

## 2023-03-01 DIAGNOSIS — O35AXX0 Maternal care for other (suspected) fetal abnormality and damage, fetal facial anomalies, not applicable or unspecified: Secondary | ICD-10-CM

## 2023-03-01 NOTE — Progress Notes (Signed)
Maternal-Fetal Medicine Consultation I had the pleasure of seeing Ms. Ilani Otterson [facility]. She is G3 P1011 at 34-weeks' gestation and is here for fetal growth assessment.  Marginal cord insertion and hypoplastic nasal bone were seen at fetal anatomy scan. Obstetrical history significant for a term vaginal delivery of a female infant weighing 8 pounds and 6 ounces at birth.  Her delivery was uncomplicated.  Patient does not have gestational diabetes.  Blood pressure today at our office is 135/83 mmHg.  Ultrasound On today's ultrasound, the estimated fetal weight is at the 98th percentile and the abdominal circumference measurement at the 99th percentile.  Attic fluid is normal and good fetal activity seen.  Cephalic presentation. Marginal cord insertion is seen again.  I explained the findings and counseled her that ultrasound has limitations in accurately estimating fetal weights.  Estimation of fetal weight has a 10% to 15% margin of error. Consistent with ACOG, we did not recommend delivery before [redacted] weeks gestation in the absence of other complications including hypertension or diabetes.  I explained the finding of marginal cord insertion and reassured her that we do not expect adverse pregnancy outcomes.  Recommendations -An appointment was made for her to return in 4 weeks for fetal growth assessment.  Consultation including face-to-face (more than 50%) counseling 30 minutes.

## 2023-03-02 ENCOUNTER — Other Ambulatory Visit (HOSPITAL_COMMUNITY)
Admission: RE | Admit: 2023-03-02 | Discharge: 2023-03-02 | Disposition: A | Discharge status: Home / Self Care | Payer: Medicaid Other | Source: Ambulatory Visit | Attending: Obstetrics | Admitting: Obstetrics

## 2023-03-02 ENCOUNTER — Ambulatory Visit (INDEPENDENT_AMBULATORY_CARE_PROVIDER_SITE_OTHER): Payer: Medicaid Other | Admitting: Obstetrics

## 2023-03-02 VITALS — BP 140/83 | HR 100 | Wt 213.6 lb

## 2023-03-02 DIAGNOSIS — N898 Other specified noninflammatory disorders of vagina: Secondary | ICD-10-CM

## 2023-03-02 DIAGNOSIS — O0990 Supervision of high risk pregnancy, unspecified, unspecified trimester: Secondary | ICD-10-CM

## 2023-03-02 DIAGNOSIS — O43193 Other malformation of placenta, third trimester: Secondary | ICD-10-CM | POA: Diagnosis not present

## 2023-03-02 DIAGNOSIS — O99820 Streptococcus B carrier state complicating pregnancy: Secondary | ICD-10-CM

## 2023-03-02 DIAGNOSIS — O99213 Obesity complicating pregnancy, third trimester: Secondary | ICD-10-CM

## 2023-03-02 DIAGNOSIS — J45909 Unspecified asthma, uncomplicated: Secondary | ICD-10-CM

## 2023-03-02 DIAGNOSIS — O3663X1 Maternal care for excessive fetal growth, third trimester, fetus 1: Secondary | ICD-10-CM

## 2023-03-02 DIAGNOSIS — R21 Rash and other nonspecific skin eruption: Secondary | ICD-10-CM

## 2023-03-02 DIAGNOSIS — O99519 Diseases of the respiratory system complicating pregnancy, unspecified trimester: Secondary | ICD-10-CM

## 2023-03-02 MED ORDER — PREDNISONE 10 MG PO TABS: 10.0000 mg | ORAL_TABLET | Freq: Every day | ORAL | 2 refills | Status: DC

## 2023-03-02 NOTE — Progress Notes (Unsigned)
Subjective:  Cassidy Meyer is a 28 y.o. G3P1011 at [redacted]w[redacted]d being seen today for ongoing prenatal care.  She is currently monitored for the following issues for this {Blank single:19197::"high-risk","low-risk"} pregnancy and has Hearing loss; Depression; Migraine; Asthma affecting pregnancy, antepartum; Tobacco smoking affecting pregnancy, antepartum; Supervision of other normal pregnancy, antepartum; Marginal insertion of umbilical cord affecting management of mother in second trimester; and Congenital hypoplasia of nasal bone on their problem list.  Patient reports {sx:14538}.  Contractions: Irritability. Vag. Bleeding: None.  Movement: Present. Denies leaking of fluid.   The following portions of the patient's history were reviewed and updated as appropriate: allergies, current medications, past family history, past medical history, past social history, past surgical history and problem list. Problem list updated.  Objective:   Vitals:   03/02/23 0949 03/02/23 0956  BP: (!) 144/91 (!) 140/83  Pulse: 100   Weight: 213 lb 9.6 oz (96.9 kg)     Fetal Status: Fetal Heart Rate (bpm): 141   Movement: Present     General:  Alert, oriented and cooperative. Patient is in no acute distress.  Skin: Skin is warm and dry. No rash noted.   Cardiovascular: Normal heart rate noted  Respiratory: Normal respiratory effort, no problems with respiration noted  Abdomen: Soft, gravid, appropriate for gestational age. Pain/Pressure: Present     Pelvic:  {Blank single:19197::"Cervical exam performed","Cervical exam deferred"}        Extremities: Normal range of motion.  Edema: None  Mental Status: Normal mood and affect. Normal behavior. Normal judgment and thought content.   Urinalysis:      ULTRASOUND:   [place] MFM OB FOLLOW UP (Accession [id number]) (Order [id number]) Imaging Date: 03/01/2023 Department: [facility] Released By: Roseanne Kaufman Authorizing: Braxton Feathers, DO   Exam Status  Status  Final [99]   PACS Intelerad Image Link   Show images for [place] MFM OB FOLLOW UP Study Result  Narrative & Impression    ----------------------------------------------------------------------  OBSTETRICS REPORT                       (Signed Final 03/01/2023 04:22 pm) ---------------------------------------------------------------------- Patient Info    ID #:       [id number]                          D.O.B.:  [birth year] (27 yrs)(F)  Name:       Cassidy Meyer                 Visit Date: 03/01/2023 03:23 pm ---------------------------------------------------------------------- Performed By    Attending:        Noralee Space MD        Secondary Phy.:   Erin Hearing                                                             MD  Performed By:     Reinaldo Raddle            [facility]          [facility]  Referred By:      [facility]             Location:         [facility]  Ref. Address:     [address]                    [address]                    [place]                    [address] ---------------------------------------------------------------------- Orders    #  Description                           Code        Ordered By  1  [place] MFM OB FOLLOW UP                   13086.57    Braxton Feathers ----------------------------------------------------------------------    #  Order #                     Accession #                Episode #  1  [id number]                   [id number]                  [id number] ---------------------------------------------------------------------- Indications    Marginal insertion of umbilical cord affecting O43.193  management of mother in third trimester  Hypoplastic nasal bone (normal variant)        O35.20XX0  Large for gestational age fetus affecting      O36.60X0  management of mother  [redacted] weeks gestation of pregnancy  Z3A.34  LR NIPS, Neg Horizon ---------------------------------------------------------------------- Fetal Evaluation    Num Of Fetuses:         1  Fetal Heart Rate(bpm):  140  Cardiac Activity:       Observed  Presentation:           Cephalic  Placenta:               Posterior Fundal  P. Cord Insertion:      Marginal insertion    Amniotic Fluid  AFI FV:      Within normal limits    AFI Sum(cm)     %Tile       Largest Pocket(cm)  12.33           37          5.84    RUQ(cm)       RLQ(cm)       LUQ(cm)        LLQ(cm)  5.84          2.3           0.96           3.23 ---------------------------------------------------------------------- Biometry    BPD:      87.9  mm     G. Age:  35w 4d         79  %    CI:        78.68   %    70 - 86                                                          FL/HC:      22.4   %    19.4 - 21.8  HC:      313.4  mm     G. Age:  35w 1d         31  %    HC/AC:      0.90        0.96 - 1.11  AC:      346.8  mm     G. Age:  38w 4d       > 99  %    FL/BPD:     79.9   %    71 - 87  FL:       70.2  mm     G. Age:  36w 0d         81  %    FL/AC:      20.2   %    20 - 24  HUM:      61.8  mm     G. Age:  35w 5d         88  %    LV:        3.5  mm    Est. FW:    3150  gm    6 lb 15 oz      98  % ---------------------------------------------------------------------- OB History    Gravidity:    3         Term:   1         SAB:   1  Living:       1 ---------------------------------------------------------------------- Gestational Age    LMP:  34w 0d        Date:  07/06/22                   EDD:   04/12/23  U/S Today:     36w 2d                                        EDD:   03/27/23  Best:          34w 3d     Det. By:  U/S C R L  (09/29/22)    EDD:   04/09/23 ---------------------------------------------------------------------- Anatomy    Cranium:               Appears normal         Aortic Arch:            Previously seen  Cavum:                 Appears normal         Ductal Arch:            Previously seen  Ventricles:            Appears normal         Diaphragm:              Appears normal  Choroid Plexus:        Previously seen        Stomach:                Appears normal, left                                                                        sided  Cerebellum:            Previously seen        Abdomen:                Previously seen  Posterior Fossa:       Previously seen        Abdominal Wall:         Previously seen  Face:                  Orbits and profile     Cord Vessels:           Previously seen                         previously seen  Lips:                  Previously seen        Kidneys:                Appear normal  Thoracic:              Previously seen        Bladder:                Appears normal  Heart:                 Appears normal  Spine:                  Previously seen                         (4CH, axis, and                         situs)  RVOT:                  Previously seen        Upper Extremities:      Previously seen  LVOT:                  Previously seen        Lower Extremities:      Previously seen    Other:  Fetal anatomic survey complete. ---------------------------------------------------------------------- Cervix Uterus Adnexa    Cervix  Not visualized (advanced GA >24wks)    Uterus  No abnormality visualized.    Right Ovary  Not visualized.  Left Ovary  Size(cm)     3.12   x   4.71   x  1.6       Vol(ml): 12.31  Within normal limits.    Cul De Sac  No free fluid seen.    Adnexa  No adnexal mass  visualized ---------------------------------------------------------------------- Impression    On today's ultrasound, the estimated fetal weight is at the  98th percentile and the abdominal circumference  measurement at the 99th percentile.  Attic fluid is normal and  good fetal activity seen.  Cephalic presentation.  Marginal cord insertion is seen again.  xxxxxxxxxxxxxxxxxxxxxxxxxxxxxxxxxxxxx  Maternal-Fetal Medicine Consultation  I had the pleasure of seeing Ms. Charlynn Salih [facility]. She is G3 P1011 at 38-  weeks' gestation and is here for fetal growth assessment.  Marginal cord insertion and hypoplastic nasal bone were  seen at fetal anatomy scan.  Obstetrical history significant for a term vaginal delivery of a  female infant weighing 8 pounds and 6 ounces at birth.  Her  delivery was uncomplicated.  Patient does not have gestational diabetes.  Blood pressure  today at our office is 135/83 mmHg.    I explained the findings and counseled her that ultrasound  has limitations in accurately estimating fetal weights.  Estimation of fetal weight has a 10% to 15% margin of error.  Consistent with ACOG, we did not recommend delivery  before [redacted] weeks gestation in the absence of other  complications including hypertension or diabetes.    I explained the finding of marginal cord insertion and  reassured her that we do not expect adverse pregnancy  outcomes.   ---------------------------------------------------------------------- Recommendations    -An appointment was made for her to return in 4 weeks for  fetal growth assessment. ----------------------------------------------------------------------                 Noralee Space, MD Electronically Signed Final Report   03/01/2023 04:22 pm ----------------------------------------------------------------------        Assessment and Plan:  Pregnancy: G3P1011 at [redacted]w[redacted]d  1. Supervision of high  risk pregnancy, antepartum (Primary) ***  2. LGA (large for gestational age) fetus affecting management of mother, third trimester, fetus 1 ***  3. Marginal insertion of umbilical cord affecting management of mother in third trimester ***  4. Vaginal discharge *** - Cervicovaginal ancillary only( [place])  5. Group B streptococcal carriage  complicating pregnancy ***  6. Obesity affecting pregnancy in third trimester, unspecified obesity type ***  7. Asthma affecting pregnancy, antepartum ***  8. Rash, generalized *** - predniSONE (DELTASONE) 10 MG tablet; Take 1 tablet (10 mg total) by mouth daily with breakfast.  Dispense: 30 tablet; Refill: 2   {Blank single:19197::"Term","Preterm"} labor symptoms and general obstetric precautions including but not limited to vaginal bleeding, contractions, leaking of fluid and fetal movement were reviewed in detail with the patient. Please refer to After Visit Summary for other counseling recommendations.   No follow-ups on file.   Brock Bad, MD 03/02/2023

## 2023-03-02 NOTE — Progress Notes (Signed)
Pt presents for ROB. Concerns about rash on legs, arms, etc. Has had for about two weeks. Pt concerned about BV.

## 2023-03-09 ENCOUNTER — Encounter: Payer: Medicaid Other | Admitting: Obstetrics and Gynecology

## 2023-03-16 ENCOUNTER — Other Ambulatory Visit (HOSPITAL_COMMUNITY)
Admission: RE | Admit: 2023-03-16 | Discharge: 2023-03-16 | Disposition: A | Discharge status: Home / Self Care | Payer: Medicaid Other | Source: Ambulatory Visit | Attending: Advanced Practice Midwife | Admitting: Advanced Practice Midwife

## 2023-03-16 ENCOUNTER — Ambulatory Visit (INDEPENDENT_AMBULATORY_CARE_PROVIDER_SITE_OTHER): Payer: Medicaid Other | Admitting: Advanced Practice Midwife

## 2023-03-16 ENCOUNTER — Encounter: Payer: Self-pay | Admitting: Advanced Practice Midwife

## 2023-03-16 VITALS — BP 129/83 | HR 98 | Wt 220.7 lb

## 2023-03-16 DIAGNOSIS — O0990 Supervision of high risk pregnancy, unspecified, unspecified trimester: Secondary | ICD-10-CM | POA: Diagnosis present

## 2023-03-16 DIAGNOSIS — Z3480 Encounter for supervision of other normal pregnancy, unspecified trimester: Secondary | ICD-10-CM

## 2023-03-16 DIAGNOSIS — Z3A36 36 weeks gestation of pregnancy: Secondary | ICD-10-CM

## 2023-03-16 DIAGNOSIS — R102 Pelvic and perineal pain: Secondary | ICD-10-CM

## 2023-03-16 DIAGNOSIS — O26893 Other specified pregnancy related conditions, third trimester: Secondary | ICD-10-CM

## 2023-03-16 NOTE — Progress Notes (Signed)
 PRENATAL VISIT NOTE  Subjective:  Cassidy Meyer is a 28 y.o. G3P1011 at [redacted]w[redacted]d being seen today for ongoing prenatal care.  She is currently monitored for the following issues for this low-risk pregnancy and has Hearing loss; Depression; Migraine; Asthma affecting pregnancy, antepartum; Tobacco smoking affecting pregnancy, antepartum; Supervision of other normal pregnancy, antepartum; Marginal insertion of umbilical cord affecting management of mother in second trimester; and Congenital hypoplasia of nasal bone on their problem list.  Patient reports  pelvic pressure, irregular cramping .  Contractions: Irritability. Vag. Bleeding: None.  Movement: Present. Denies leaking of fluid.   The following portions of the patient's history were reviewed and updated as appropriate: allergies, current medications, past family history, past medical history, past social history, past surgical history and problem list.   Objective:   Vitals:   03/16/23 1120  BP: 129/83  Pulse: 98  Weight: 220 lb 11.2 oz (100.1 kg)    Fetal Status: Fetal Heart Rate (bpm): 138 Fundal Height: 38 cm Movement: Present     General:  Alert, oriented and cooperative. Patient is in no acute distress.  Skin: Skin is warm and dry. No rash noted.   Cardiovascular: Normal heart rate noted  Respiratory: Normal respiratory effort, no problems with respiration noted  Abdomen: Soft, gravid, appropriate for gestational age.  Pain/Pressure: Present     Pelvic: Cervical exam performed in the presence of a chaperone Dilation: 1 Effacement (%): 50 Station: -3  Extremities: Normal range of motion.  Edema: Trace (Feet and legs)  Mental Status: Normal mood and affect. Normal behavior. Normal judgment and thought content.   Assessment and Plan:  Pregnancy: G3P1011 at [redacted]w[redacted]d 1. Supervision of other normal pregnancy, antepartum --Anticipatory guidance about next visits/weeks of pregnancy given.  - Pt interested in waterbirth and has  attended the class.  - Reviewed conditions in labor that will risk her out of water immersion including thick meconium or blood stained amniotic fluid, non-reassuring fetal status on monitor, excessive bleeding, hypertension, dizziness, use of IV meds, damaged equipment or staffing that does not allow for water immersion, etc.  - The waterbirth provider must be on the unit for water immersion to begin; pt understands this may delay the start of water immersion. - Reminded pt that signing consent in labor at the hospital also acknowledges they will exit the tub if the attending midwife requests. - Consent will be reviewed and signed at the hospital by the waterbirth provider prior to use of the tub. - Discussed other labor support options if waterbirth becomes unavailable, including position change, freedom of movement, use of birthing ball, and/or use of hydrotherapy in the shower (dependent upon medical condition/provider discretion).    - Culture, beta strep (group b only) - Cervicovaginal ancillary only( [place])  2. Pelvic pain affecting pregnancy in third trimester, antepartum (Primary) --Rest/ice/heat/warm bath/increase PO fluids/Tylenol/pregnancy support belt   3. [redacted] weeks gestation of pregnancy   Term labor symptoms and general obstetric precautions including but not limited to vaginal bleeding, contractions, leaking of fluid and fetal movement were reviewed in detail with the patient. Please refer to After Visit Summary for other counseling recommendations.   Return in about 1 week (around 03/23/2023) for Midwife preferred, LOB.  Future Appointments  Date Time Provider Department Center  03/23/2023 11:15 AM Ralene Muskrat, [place] [facility] None  03/29/2023  3:30 PM [facility]  03/30/2023 11:15 AM Sue Lush, FNP [facility] None  04/06/2023 11:15 AM Ralene Muskrat, PA-C [facility]  None  04/13/2023 11:15 AM Sue Lush, FNP [facility] None    Sharen Counter, CNM

## 2023-03-16 NOTE — Progress Notes (Signed)
 Pt. Presents for rob. Pt has her gbs and swab due today. Pt is complaining of back, side, and pelvic pain.

## 2023-03-17 LAB — CERVICOVAGINAL ANCILLARY ONLY
Chlamydia: NEGATIVE
Comment: NEGATIVE
Comment: NEGATIVE
Comment: NORMAL
Neisseria Gonorrhea: NEGATIVE
Trichomonas: NEGATIVE

## 2023-03-20 LAB — CULTURE, BETA STREP (GROUP B ONLY): Strep Gp B Culture: NEGATIVE

## 2023-03-23 ENCOUNTER — Ambulatory Visit: Payer: Medicaid Other | Admitting: Medical

## 2023-03-23 ENCOUNTER — Encounter: Payer: Self-pay | Admitting: Medical

## 2023-03-23 VITALS — BP 130/88 | HR 104 | Wt 223.3 lb

## 2023-03-23 DIAGNOSIS — O43193 Other malformation of placenta, third trimester: Secondary | ICD-10-CM | POA: Diagnosis not present

## 2023-03-23 DIAGNOSIS — O368130 Decreased fetal movements, third trimester, not applicable or unspecified: Secondary | ICD-10-CM | POA: Diagnosis not present

## 2023-03-23 DIAGNOSIS — Z3A37 37 weeks gestation of pregnancy: Secondary | ICD-10-CM | POA: Diagnosis not present

## 2023-03-23 DIAGNOSIS — Z3480 Encounter for supervision of other normal pregnancy, unspecified trimester: Secondary | ICD-10-CM

## 2023-03-23 DIAGNOSIS — O43192 Other malformation of placenta, second trimester: Secondary | ICD-10-CM

## 2023-03-23 DIAGNOSIS — O99513 Diseases of the respiratory system complicating pregnancy, third trimester: Secondary | ICD-10-CM | POA: Diagnosis not present

## 2023-03-23 DIAGNOSIS — J45909 Unspecified asthma, uncomplicated: Secondary | ICD-10-CM

## 2023-03-23 NOTE — Progress Notes (Signed)
   PRENATAL VISIT NOTE  Subjective:  Cassidy Meyer is a 28 y.o. G3P1011 at [redacted]w[redacted]d being seen today for ongoing prenatal care.  She is currently monitored for the following issues for this low-risk pregnancy and has Hearing loss; Depression; Migraine; Asthma affecting pregnancy, antepartum; Tobacco smoking affecting pregnancy, antepartum; Supervision of other normal pregnancy, antepartum; Marginal insertion of umbilical cord affecting management of mother in second trimester; and Congenital hypoplasia of nasal bone on their problem list.  Patient reports backache, fatigue, and occasional contractions.  Contractions: Irritability. Vag. Bleeding: None (leaking clear fluid, no bleeding).  Movement: (!) Decreased. Denies leaking of fluid.   The following portions of the patient's history were reviewed and updated as appropriate: allergies, current medications, past family history, past medical history, past social history, past surgical history and problem list.   Objective:   Vitals:   03/23/23 1122  BP: 130/88  Pulse: (!) 104  Weight: 223 lb 4.8 oz (101.3 kg)    Fetal Status: Fetal Heart Rate (bpm): 141   Movement: (!) Decreased     General:  Alert, oriented and cooperative. Patient is in no acute distress.  Skin: Skin is warm and dry. No rash noted.   Cardiovascular: Normal heart rate noted  Respiratory: Normal respiratory effort, no problems with respiration noted  Abdomen: Soft, gravid, appropriate for gestational age.  Pain/Pressure: Present     Pelvic: Cervical exam deferred        Extremities: Normal range of motion.  Edema: Trace  Mental Status: Normal mood and affect. Normal behavior. Normal judgment and thought content.   Assessment and Plan:  Pregnancy: G3P1011 at [redacted]w[redacted]d 1. Supervision of other normal pregnancy, antepartum (Primary) - Normal GBC, GC/CT  2. Marginal insertion of umbilical cord affecting management of mother in second trimester - Follow-up US 3/11 at  MFM  3. Asthma affecting pregnancy, antepartum  4. Decreased fetal movements in third trimester, single or unspecified fetus - NST reactive today  Fetal Monitoring: Baseline: 130 bpm Variability: moderate Accelerations: 15 x 15 Decelerations: none Contractions: irregular   5. Back pain in pregnancy, third trimester  - using belly band - discussed hydrotherapy, sleep hygiene, melatonin and/or Mg for bedtime  6. [redacted] weeks gestation of pregnancy  Term labor symptoms and general obstetric precautions including but not limited to vaginal bleeding, contractions, leaking of fluid and fetal movement were reviewed in detail with the patient. Please refer to After Visit Summary for other counseling recommendations.   Return in about 1 week (around 03/30/2023) for LOB.  Future Appointments  Date Time Provider Department Center  03/29/2023  3:30 PM [facility]  03/30/2023 11:15 AM Sue Lush, FNP [facility] None  04/06/2023 11:15 AM Ralene Muskrat, PA-C [facility] None  04/13/2023 11:15 AM Sue Lush, FNP [facility] None    Vonzella Nipple, PA-C

## 2023-03-23 NOTE — Progress Notes (Signed)
 Pt presents for rob. Pt  has had decreased fetal movement and leaking clear fluid.

## 2023-03-24 ENCOUNTER — Inpatient Hospital Stay (HOSPITAL_COMMUNITY)
Admission: AD | Admit: 2023-03-24 | Discharge: 2023-03-24 | Disposition: A | Discharge status: Home / Self Care | Attending: Obstetrics and Gynecology | Admitting: Obstetrics and Gynecology

## 2023-03-24 ENCOUNTER — Encounter (HOSPITAL_COMMUNITY): Payer: Self-pay | Admitting: Obstetrics and Gynecology

## 2023-03-24 DIAGNOSIS — O471 False labor at or after 37 completed weeks of gestation: Secondary | ICD-10-CM | POA: Diagnosis present

## 2023-03-24 DIAGNOSIS — Z3A37 37 weeks gestation of pregnancy: Secondary | ICD-10-CM | POA: Diagnosis not present

## 2023-03-24 DIAGNOSIS — O479 False labor, unspecified: Secondary | ICD-10-CM

## 2023-03-24 NOTE — MAU Note (Signed)
.  Cassidy Meyer is a 28 y.o. at [redacted]w[redacted]d here in MAU reporting:   Contractions every: 5 minutes Onset of ctx: 2 hours Pain score: 3/10  ROM: intact Vaginal Bleeding: none Last SVE: 1cm 50% Labor Pain Management Plan: Planning Waterbirth  Fetal Movement: Reports positive FM FHT:  160  Vitals:   03/24/23 0349  BP: 129/88  Pulse: 98  Resp: 15  Temp: 98.2 F (36.8 C)  SpO2: 100%     OB Office: Faculty GBS: Negative Lab orders placed from triage: MAU Labor Eval

## 2023-03-24 NOTE — MAU Provider Note (Signed)
 S: Ms. Cassidy Meyer is a 28 y.o. G3P1011 at [redacted]w[redacted]d  who presents to MAU today for labor evaluation.     Cervical exam by RN:  Dilation: 1.5 Effacement (%): 50 Station: -2 Presentation: Vertex Exam by:: Henrine Screws, RN  Fetal Monitoring: Baseline: 140 Variability: moderate Accelerations: present Decelerations: absent Contractions: Q3+  MDM Discussed patient with RN. NST reviewed. Patient declined staying for a recheck.  A: SIUP at [redacted]w[redacted]d  False labor  P: Discharge home Labor precautions and kick counts included in AVS Patient to follow-up with primary OB as scheduled  Patient may return to MAU as needed or when in labor   Joanne Gavel, MD 03/24/2023 4:25 AM

## 2023-03-25 ENCOUNTER — Telehealth: Payer: Self-pay

## 2023-03-25 NOTE — Telephone Encounter (Signed)
 RS

## 2023-03-25 NOTE — Telephone Encounter (Signed)
 Patient returned our call can do 12:30pm ultrasound appointment time for next Tuesday 3/11

## 2023-03-27 ENCOUNTER — Inpatient Hospital Stay (HOSPITAL_COMMUNITY)
Admission: AD | Admit: 2023-03-27 | Discharge: 2023-03-27 | Disposition: A | Discharge status: Home / Self Care | Attending: Obstetrics & Gynecology | Admitting: Obstetrics & Gynecology

## 2023-03-27 ENCOUNTER — Other Ambulatory Visit: Payer: Self-pay

## 2023-03-27 ENCOUNTER — Encounter (HOSPITAL_COMMUNITY): Payer: Self-pay | Admitting: Obstetrics & Gynecology

## 2023-03-27 DIAGNOSIS — O26893 Other specified pregnancy related conditions, third trimester: Secondary | ICD-10-CM | POA: Diagnosis not present

## 2023-03-27 DIAGNOSIS — R519 Headache, unspecified: Secondary | ICD-10-CM | POA: Diagnosis not present

## 2023-03-27 DIAGNOSIS — O26891 Other specified pregnancy related conditions, first trimester: Secondary | ICD-10-CM | POA: Insufficient documentation

## 2023-03-27 DIAGNOSIS — Z3A37 37 weeks gestation of pregnancy: Secondary | ICD-10-CM | POA: Diagnosis not present

## 2023-03-27 HISTORY — DX: Dermatitis, unspecified: L30.9

## 2023-03-27 LAB — CBC
HCT: 34.7 % — ABNORMAL LOW (ref 36.0–46.0)
Hemoglobin: 11.2 g/dL — ABNORMAL LOW (ref 12.0–15.0)
MCH: 26.9 pg (ref 26.0–34.0)
MCHC: 32.3 g/dL (ref 30.0–36.0)
MCV: 83.2 fL (ref 80.0–100.0)
Platelets: 146 10*3/uL — ABNORMAL LOW (ref 150–400)
RBC: 4.17 MIL/uL (ref 3.87–5.11)
RDW: 13.5 % (ref 11.5–15.5)
WBC: 6.6 10*3/uL (ref 4.0–10.5)
nRBC: 0 % (ref 0.0–0.2)

## 2023-03-27 LAB — URINALYSIS, ROUTINE W REFLEX MICROSCOPIC
Bilirubin Urine: NEGATIVE
Glucose, UA: NEGATIVE mg/dL
Hgb urine dipstick: NEGATIVE
Ketones, ur: NEGATIVE mg/dL
Leukocytes,Ua: NEGATIVE
Nitrite: NEGATIVE
Protein, ur: NEGATIVE mg/dL
Specific Gravity, Urine: 1.003 — ABNORMAL LOW (ref 1.005–1.030)
pH: 7 (ref 5.0–8.0)

## 2023-03-27 LAB — PROTEIN / CREATININE RATIO, URINE
Creatinine, Urine: 26 mg/dL
Total Protein, Urine: <6 mg/dL

## 2023-03-27 LAB — COMPREHENSIVE METABOLIC PANEL
ALT: 12 U/L (ref 0–44)
AST: 15 U/L (ref 15–41)
Albumin: 2.7 g/dL — ABNORMAL LOW (ref 3.5–5.0)
Alkaline Phosphatase: 97 U/L (ref 38–126)
Anion gap: 7 (ref 5–15)
BUN: 5 mg/dL — ABNORMAL LOW (ref 6–20)
CO2: 23 mmol/L (ref 22–32)
Calcium: 9.5 mg/dL (ref 8.9–10.3)
Chloride: 105 mmol/L (ref 98–111)
Creatinine, Ser: 0.7 mg/dL (ref 0.44–1.00)
GFR, Estimated: >60 mL/min (ref >60)
Glucose, Bld: 97 mg/dL (ref 70–99)
Potassium: 3.5 mmol/L (ref 3.5–5.1)
Sodium: 135 mmol/L (ref 135–145)
Total Bilirubin: 0.5 mg/dL (ref 0.0–1.2)
Total Protein: 6.9 g/dL (ref 6.5–8.1)

## 2023-03-27 MED ORDER — ACETAMINOPHEN 500 MG PO TABS
1000.0000 mg | ORAL_TABLET | Freq: Once | ORAL | Status: AC
  Administered 2023-03-27: 1000 mg via ORAL
  Filled 2023-03-27: qty 2

## 2023-03-27 NOTE — MAU Note (Signed)
 Cassidy Meyer is a 28 y.o. at [redacted]w[redacted]d here in MAU reporting: Patient reports feeling dizzy and lightheaded at 1500. She took her blood pressure and reports reading of 160's. Patient reports decreased fetal movement today  Onset of complaint: denies  Pain score: 4 lower right abdominal cramping   Lab orders placed from triage:  ua

## 2023-03-27 NOTE — MAU Provider Note (Signed)
 History     CSN: [id number]  Arrival date and time: 03/27/23 1600   Event Date/Time   First Provider Initiated Contact with Patient 03/27/23 1653      Chief Complaint  Patient presents with   Dizziness   Hypertension   HPI Cassidy Meyer is a 28 y.o. G3P1011 at 101w5d who presents with dizziness, hypertension, & headache. Symptoms started earlier today. Reports feeling lightheaded. Also reports headache that she rates 7/10. Took her BP at home & SBP was in the 160s. Denies epigastric pain. Reports irregular contractions. Denies LOF or vaginal bleeding. Good fetal movement.   OB History     Gravida  3   Para  1   Term  1   Preterm  0   AB  1   Living  1      SAB  0   IAB  1   Ectopic  0   Multiple  0   Live Births  1           Past Medical History:  Diagnosis Date   Asthma    when young   Bronchitis    Decreased fetal movement 08/31/2017   Eczema    Headache    Normal labor 09/01/2017    Past Surgical History:  Procedure Laterality Date   APPENDECTOMY     HERNIA REPAIR      Family History  Problem Relation Age of Onset   Hypertension Mother    Epilepsy Mother    Heart disease Mother    Healthy Father    Cancer Maternal Aunt        bladder   Dementia Maternal Grandmother    Diabetes Neg Hx     Social History   Tobacco Use   Smoking status: Former    Current packs/day: 0.25    Average packs/day: 0.3 packs/day for 7.0 years (1.8 ttl pk-yrs)    Types: Cigarettes   Smokeless tobacco: Never  Vaping Use   Vaping status: Never Used  Substance Use Topics   Alcohol use: No   Drug use: Not Currently    Allergies: No Known Allergies  Medications Prior to Admission  Medication Sig Dispense Refill Last Dose/Taking   Prenatal Multivit-Min-Fe-FA (PRENATAL VITAMINS PO) Take 1 tablet by mouth daily.    03/26/2023 Evening   Blood Pressure Monitoring (BLOOD PRESSURE KIT) DEVI 1 Device by Does not apply route once a week. 1 each 0      Review of Systems  All other systems reviewed and are negative.  Physical Exam   Blood pressure 134/82, pulse (!) 108, temperature 98.4 F (36.9 C), temperature source Oral, resp. rate 20, last menstrual period 07/06/2022, SpO2 98%, unknown if currently breastfeeding.  Patient Vitals for the past 24 hrs:  BP Temp Temp src Pulse Resp SpO2  03/27/23 1807 125/75 -- -- (!) 102 -- --  03/27/23 1652 133/75 -- -- (!) 109 -- --  03/27/23 1649 124/68 -- -- (!) 109 -- --  03/27/23 1648 129/85 -- -- (!) 107 -- --  03/27/23 1632 134/82 -- -- (!) 108 -- 99 %  03/27/23 1620 136/88 98.4 F (36.9 C) Oral (!) 115 20 98 %    Physical Exam Vitals and nursing note reviewed.  Constitutional:      General: She is not in acute distress.    Appearance: She is well-developed. She is not ill-appearing.  HENT:     Head: Normocephalic and atraumatic.  Eyes:     General:  No scleral icterus.       Right eye: No discharge.        Left eye: No discharge.     Conjunctiva/sclera: Conjunctivae normal.  Pulmonary:     Effort: Pulmonary effort is normal. No respiratory distress.  Neurological:     General: No focal deficit present.     Mental Status: She is alert.  Psychiatric:        Mood and Affect: Mood normal.        Behavior: Behavior normal.    NST:  Baseline: 145 bpm, Variability: Good {> 6 bpm), Accelerations: Reactive, and Decelerations: Absent   MAU Course  Procedures Results for orders placed or performed during the hospital encounter of 03/27/23 (from the past 24 hours)  Urinalysis, Routine w reflex microscopic -Urine, Clean Catch     Status: Abnormal   Collection Time: 03/27/23  4:13 PM  Result Value Ref Range   Color, Urine STRAW (A) YELLOW   APPearance CLEAR CLEAR   Specific Gravity, Urine 1.003 (L) 1.005 - 1.030   pH 7.0 5.0 - 8.0   Glucose, UA NEGATIVE NEGATIVE mg/dL   Hgb urine dipstick NEGATIVE NEGATIVE   Bilirubin Urine NEGATIVE NEGATIVE   Ketones, ur NEGATIVE NEGATIVE  mg/dL   Protein, ur NEGATIVE NEGATIVE mg/dL   Nitrite NEGATIVE NEGATIVE   Leukocytes,Ua NEGATIVE NEGATIVE  Protein / creatinine ratio, urine     Status: None   Collection Time: 03/27/23  4:13 PM  Result Value Ref Range   Creatinine, Urine 26 mg/dL   Total Protein, Urine <6 mg/dL   Protein Creatinine Ratio        0.00 - 0.15 mg/mg[Cre]  CBC     Status: Abnormal   Collection Time: 03/27/23  5:12 PM  Result Value Ref Range   WBC 6.6 4.0 - 10.5 K/uL   RBC 4.17 3.87 - 5.11 MIL/uL   Hemoglobin 11.2 (L) 12.0 - 15.0 g/dL   HCT 16.1 (L) 09.6 - 04.5 %   MCV 83.2 80.0 - 100.0 fL   MCH 26.9 26.0 - 34.0 pg   MCHC 32.3 30.0 - 36.0 g/dL   RDW 40.9 81.1 - 91.4 %   Platelets 146 (L) 150 - 400 K/uL   nRBC 0.0 0.0 - 0.2 %  Comprehensive metabolic panel     Status: Abnormal   Collection Time: 03/27/23  5:12 PM  Result Value Ref Range   Sodium 135 135 - 145 mmol/L   Potassium 3.5 3.5 - 5.1 mmol/L   Chloride 105 98 - 111 mmol/L   CO2 23 22 - 32 mmol/L   Glucose, Bld 97 70 - 99 mg/dL   BUN 5 (L) 6 - 20 mg/dL   Creatinine, Ser 7.82 0.44 - 1.00 mg/dL   Calcium 9.5 8.9 - 95.6 mg/dL   Total Protein 6.9 6.5 - 8.1 g/dL   Albumin 2.7 (L) 3.5 - 5.0 g/dL   AST 15 15 - 41 U/L   ALT 12 0 - 44 U/L   Alkaline Phosphatase 97 38 - 126 U/L   Total Bilirubin 0.5 0.0 - 1.2 mg/dL   GFR, Estimated >21 >30 mL/min   Anion gap 7 5 - 15    MDM   Assessment and Plan   1. Pregnancy headache in third trimester   2. [redacted] weeks gestation of pregnancy    -Normotensive in MAU -Not orthostatic. Labs ordered & reviewed. Symptoms improved with tylenol.  -Stable for discharge home. Has f/u with OB in 2 days.  Reviewed reasons to return to MAU  Judeth Horn 03/27/2023, 4:53 PM

## 2023-03-29 ENCOUNTER — Other Ambulatory Visit: Payer: Self-pay

## 2023-03-29 ENCOUNTER — Ambulatory Visit: Payer: Medicaid Other | Attending: Obstetrics and Gynecology

## 2023-03-29 DIAGNOSIS — O43193 Other malformation of placenta, third trimester: Secondary | ICD-10-CM

## 2023-03-29 DIAGNOSIS — O3663X0 Maternal care for excessive fetal growth, third trimester, not applicable or unspecified: Secondary | ICD-10-CM | POA: Insufficient documentation

## 2023-03-29 DIAGNOSIS — Z3A38 38 weeks gestation of pregnancy: Secondary | ICD-10-CM | POA: Diagnosis not present

## 2023-03-29 DIAGNOSIS — O43199 Other malformation of placenta, unspecified trimester: Secondary | ICD-10-CM | POA: Insufficient documentation

## 2023-03-29 DIAGNOSIS — O35AXX0 Maternal care for other (suspected) fetal abnormality and damage, fetal facial anomalies, not applicable or unspecified: Secondary | ICD-10-CM | POA: Diagnosis not present

## 2023-03-30 ENCOUNTER — Ambulatory Visit (INDEPENDENT_AMBULATORY_CARE_PROVIDER_SITE_OTHER): Payer: Medicaid Other | Admitting: Obstetrics and Gynecology

## 2023-03-30 VITALS — BP 133/86 | HR 85 | Wt 225.4 lb

## 2023-03-30 DIAGNOSIS — O43192 Other malformation of placenta, second trimester: Secondary | ICD-10-CM

## 2023-03-30 DIAGNOSIS — Z3480 Encounter for supervision of other normal pregnancy, unspecified trimester: Secondary | ICD-10-CM | POA: Diagnosis not present

## 2023-03-30 DIAGNOSIS — Z3A38 38 weeks gestation of pregnancy: Secondary | ICD-10-CM

## 2023-03-30 DIAGNOSIS — O3663X1 Maternal care for excessive fetal growth, third trimester, fetus 1: Secondary | ICD-10-CM | POA: Diagnosis not present

## 2023-03-30 NOTE — Progress Notes (Signed)
   PRENATAL VISIT NOTE  Subjective:  Cassidy Meyer is a 28 y.o. G3P1011 at [redacted]w[redacted]d being seen today for ongoing prenatal care.  She is currently monitored for the following issues for this low-risk pregnancy and has Hearing loss; Depression; Migraine; Asthma affecting pregnancy, antepartum; Tobacco smoking affecting pregnancy, antepartum; Supervision of other normal pregnancy, antepartum; Marginal insertion of umbilical cord affecting management of mother in second trimester; and Congenital hypoplasia of nasal bone on their problem list.  Patient reports  tingling in hands  .  Contractions: Not present. Vag. Bleeding: None.  Movement: Present. Denies leaking of fluid.   The following portions of the patient's history were reviewed and updated as appropriate: allergies, current medications, past family history, past medical history, past social history, past surgical history and problem list.   Objective:   Vitals:   03/30/23 1127  BP: 133/86  Pulse: 85  Weight: 225 lb 6.4 oz (102.2 kg)    Fetal Status:     Movement: Present     General:  Alert, oriented and cooperative. Patient is in no acute distress.  Skin: Skin is warm and dry. No rash noted.   Cardiovascular: Normal heart rate noted  Respiratory: Normal respiratory effort, no problems with respiration noted  Abdomen: Soft, gravid, appropriate for gestational age.  Pain/Pressure: Present     Pelvic: Cervical exam performed in the presence of a chaperone        Extremities: Normal range of motion.  Edema: Trace  Mental Status: Normal mood and affect. Normal behavior. Normal judgment and thought content.   Assessment and Plan:  Pregnancy: G3P1011 at [redacted]w[redacted]d 1. Supervision of other normal pregnancy, antepartum (Primary) BP and FHR normal Doing well, feeling regular movement    2. LGA (large for gestational age) fetus affecting management of mother, third trimester, fetus 1 3/11 u/s 90%, AC 99%, afi normal   3. [redacted] weeks gestation  of pregnancy Supportive measures discussed for tingling, ice, wrist brace if pain increases Discussed membrane sweep next week,   4. Marginal insertion of umbilical cord affecting management of mother in second trimester Following MFM   Preterm labor symptoms and general obstetric precautions including but not limited to vaginal bleeding, contractions, leaking of fluid and fetal movement were reviewed in detail with the patient. Please refer to After Visit Summary for other counseling recommendations.   Return in one week for routine prenatal   Future Appointments  Date Time Provider Department Center  04/06/2023 11:15 AM Ralene Muskrat, PA-C [facility] None  04/13/2023 11:15 AM Sue Lush, FNP [facility] None    Albertine Grates, FNP

## 2023-03-30 NOTE — Progress Notes (Signed)
 Pt presents for ROB visit. Pt c/o tingling in the hands. Requesting cervical check

## 2023-03-30 NOTE — Patient Instructions (Signed)
 Things to Try After 37 weeks to Encourage Labor/Get Ready for Labor:    Try the [employer] at [email or web address].[email or web address] daily to improve baby's position and encourage the onset of labor.  Walk a little and rest a little every day.  Change positions often.  Cervical Ripening: May try one or both Red Raspberry Leaf capsules or tea:  two 300mg  or 400mg  tablets with each meal, 2-3 times a day, or 1-3 cups of tea daily  Potential Side Effects Of Raspberry Leaf:  Most women do not experience any side effects from drinking raspberry leaf tea. However, nausea and loose stools are possible   Evening Primrose Oil capsules: take 1 capsule by mouth and place one capsule in the vagina every night.    Some of the potential side effects:  Upset stomach  Loose stools or diarrhea  Headaches  Nausea  Sex can also help the cervix ripen and encourage labor onset.

## 2023-04-05 NOTE — Progress Notes (Unsigned)
   PRENATAL VISIT NOTE  Subjective:  Cassidy Meyer is a 28 y.o. G3P1011 at [redacted]w[redacted]d being seen today for ongoing prenatal care.  She is currently monitored for the following issues for this {Blank single:19197::"high-risk","low-risk"} pregnancy and has Hearing loss; Depression; Migraine; Asthma affecting pregnancy, antepartum; Tobacco smoking affecting pregnancy, antepartum; Supervision of other normal pregnancy, antepartum; Marginal insertion of umbilical cord affecting management of mother in second trimester; and Congenital hypoplasia of nasal bone on their problem list.  Patient reports {sx:14538}.   .  .   . Denies leaking of fluid.   The following portions of the patient's history were reviewed and updated as appropriate: allergies, current medications, past family history, past medical history, past social history, past surgical history and problem list.   Objective:  There were no vitals filed for this visit.  Fetal Status:           General:  Alert, oriented and cooperative. Patient is in no acute distress.  Skin: Skin is warm and dry. No rash noted.   Cardiovascular: Normal heart rate noted  Respiratory: Normal respiratory effort, no problems with respiration noted  Abdomen: Soft, gravid, appropriate for gestational age.        Pelvic: Cervical exam deferred        Extremities: Normal range of motion.     Mental Status: Normal mood and affect. Normal behavior. Normal judgment and thought content.   Assessment and Plan:  Pregnancy: G3P1011 at [redacted]w[redacted]d  1. Supervision of other normal pregnancy, antepartum (Primary) Patient is doing well, feeling regular fetal movement BP, FHR, FH appropriate  2. [redacted] weeks gestation of pregnancy Anticipatory guidance about next visits/weeks of pregnancy given.   3. Marginal insertion of umbilical cord affecting management of mother in third trimester ***  4. Excessive fetal growth affecting management of pregnancy in third trimester, single or  unspecified fetus Negative 2hr GTT EFW 90%   Term labor symptoms and general obstetric precautions including but not limited to vaginal bleeding, contractions, leaking of fluid and fetal movement were reviewed in detail with the patient.  Please refer to After Visit Summary for other counseling recommendations.   No follow-ups on file.  Future Appointments  Date Time Provider Department Center  04/06/2023 11:15 AM Ralene Muskrat, PA-C [facility] None  04/13/2023 11:15 AM Sue Lush, FNP [facility] None    Ralene Muskrat, [place]

## 2023-04-06 ENCOUNTER — Ambulatory Visit (INDEPENDENT_AMBULATORY_CARE_PROVIDER_SITE_OTHER): Payer: Medicaid Other | Admitting: Physician Assistant

## 2023-04-06 VITALS — BP 134/92 | HR 103 | Wt 232.0 lb

## 2023-04-06 DIAGNOSIS — O43193 Other malformation of placenta, third trimester: Secondary | ICD-10-CM | POA: Diagnosis not present

## 2023-04-06 DIAGNOSIS — O3663X0 Maternal care for excessive fetal growth, third trimester, not applicable or unspecified: Secondary | ICD-10-CM | POA: Diagnosis not present

## 2023-04-06 DIAGNOSIS — Z3A39 39 weeks gestation of pregnancy: Secondary | ICD-10-CM

## 2023-04-06 DIAGNOSIS — R030 Elevated blood-pressure reading, without diagnosis of hypertension: Secondary | ICD-10-CM

## 2023-04-06 DIAGNOSIS — Z3480 Encounter for supervision of other normal pregnancy, unspecified trimester: Secondary | ICD-10-CM

## 2023-04-07 ENCOUNTER — Encounter: Payer: Self-pay | Admitting: Physician Assistant

## 2023-04-09 ENCOUNTER — Inpatient Hospital Stay (HOSPITAL_COMMUNITY)
Admission: AD | Admit: 2023-04-09 | Discharge: 2023-04-11 | DRG: 807 | Disposition: A | Discharge status: Home / Self Care | Payer: Medicaid Other | Attending: Obstetrics and Gynecology | Admitting: Obstetrics and Gynecology

## 2023-04-09 ENCOUNTER — Inpatient Hospital Stay (HOSPITAL_COMMUNITY): Admitting: Anesthesiology

## 2023-04-09 ENCOUNTER — Encounter (HOSPITAL_COMMUNITY): Payer: Self-pay | Admitting: Obstetrics & Gynecology

## 2023-04-09 DIAGNOSIS — Z87891 Personal history of nicotine dependence: Secondary | ICD-10-CM

## 2023-04-09 DIAGNOSIS — O1400 Mild to moderate pre-eclampsia, unspecified trimester: Secondary | ICD-10-CM

## 2023-04-09 DIAGNOSIS — O4423 Partial placenta previa NOS or without hemorrhage, third trimester: Secondary | ICD-10-CM | POA: Diagnosis not present

## 2023-04-09 DIAGNOSIS — Z8249 Family history of ischemic heart disease and other diseases of the circulatory system: Secondary | ICD-10-CM | POA: Diagnosis not present

## 2023-04-09 DIAGNOSIS — O43193 Other malformation of placenta, third trimester: Secondary | ICD-10-CM | POA: Diagnosis present

## 2023-04-09 DIAGNOSIS — O43192 Other malformation of placenta, second trimester: Secondary | ICD-10-CM | POA: Diagnosis present

## 2023-04-09 DIAGNOSIS — Z3A39 39 weeks gestation of pregnancy: Secondary | ICD-10-CM

## 2023-04-09 DIAGNOSIS — O1404 Mild to moderate pre-eclampsia, complicating childbirth: Principal | ICD-10-CM | POA: Diagnosis present

## 2023-04-09 DIAGNOSIS — O26893 Other specified pregnancy related conditions, third trimester: Secondary | ICD-10-CM | POA: Diagnosis present

## 2023-04-09 HISTORY — DX: Mild to moderate pre-eclampsia, unspecified trimester: O14.00

## 2023-04-09 LAB — PROTEIN / CREATININE RATIO, URINE
Creatinine, Urine: 89 mg/dL
Protein Creatinine Ratio: 0.48 mg/mg{creat} — ABNORMAL HIGH (ref 0.00–0.15)
Total Protein, Urine: 43 mg/dL

## 2023-04-09 LAB — COMPREHENSIVE METABOLIC PANEL
ALT: 13 U/L (ref 0–44)
AST: 28 U/L (ref 15–41)
Albumin: 3.2 g/dL — ABNORMAL LOW (ref 3.5–5.0)
Alkaline Phosphatase: 122 U/L (ref 38–126)
Anion gap: 10 (ref 5–15)
BUN: 7 mg/dL (ref 6–20)
CO2: 22 mmol/L (ref 22–32)
Calcium: 9.5 mg/dL (ref 8.9–10.3)
Chloride: 103 mmol/L (ref 98–111)
Creatinine, Ser: 0.67 mg/dL (ref 0.44–1.00)
GFR, Estimated: >60 mL/min (ref >60)
Glucose, Bld: 80 mg/dL (ref 70–99)
Potassium: 4.4 mmol/L (ref 3.5–5.1)
Sodium: 135 mmol/L (ref 135–145)
Total Bilirubin: 0.8 mg/dL (ref 0.0–1.2)
Total Protein: 7.5 g/dL (ref 6.5–8.1)

## 2023-04-09 LAB — CBC
HCT: 36.3 % (ref 36.0–46.0)
HCT: 34.9 % — ABNORMAL LOW (ref 36.0–46.0)
Hemoglobin: 11.8 g/dL — ABNORMAL LOW (ref 12.0–15.0)
Hemoglobin: 11.3 g/dL — ABNORMAL LOW (ref 12.0–15.0)
MCH: 27.1 pg (ref 26.0–34.0)
MCH: 27.1 pg (ref 26.0–34.0)
MCHC: 32.5 g/dL (ref 30.0–36.0)
MCHC: 32.4 g/dL (ref 30.0–36.0)
MCV: 83.3 fL (ref 80.0–100.0)
MCV: 83.7 fL (ref 80.0–100.0)
Platelets: 148 10*3/uL — ABNORMAL LOW (ref 150–400)
Platelets: 124 10*3/uL — ABNORMAL LOW (ref 150–400)
RBC: 4.36 MIL/uL (ref 3.87–5.11)
RBC: 4.17 MIL/uL (ref 3.87–5.11)
RDW: 14.1 % (ref 11.5–15.5)
RDW: 14.2 % (ref 11.5–15.5)
WBC: 11.3 10*3/uL — ABNORMAL HIGH (ref 4.0–10.5)
WBC: 15.6 10*3/uL — ABNORMAL HIGH (ref 4.0–10.5)
nRBC: 0 % (ref 0.0–0.2)
nRBC: 0 % (ref 0.0–0.2)

## 2023-04-09 LAB — TYPE AND SCREEN
ABO/RH(D): B POS
Antibody Screen: NEGATIVE

## 2023-04-09 LAB — RPR: RPR Ser Ql: NONREACTIVE

## 2023-04-09 MED ORDER — DIPHENHYDRAMINE HCL 25 MG PO CAPS: 25.0000 mg | ORAL_CAPSULE | Freq: Four times a day (QID) | ORAL | Status: DC | PRN

## 2023-04-09 MED ORDER — FUROSEMIDE 20 MG PO TABS
20.0000 mg | ORAL_TABLET | Freq: Every day | ORAL | Status: DC
Start: 2023-04-09 — End: 2023-04-14
  Administered 2023-04-09 – 2023-04-11 (×3): 20 mg via ORAL
  Filled 2023-04-09 (×3): qty 1

## 2023-04-09 MED ORDER — SOD CITRATE-CITRIC ACID 500-334 MG/5ML PO SOLN: 30.0000 mL | ORAL | Status: DC | PRN

## 2023-04-09 MED ORDER — TETANUS-DIPHTH-ACELL PERTUSSIS 5-2.5-18.5 LF-MCG/0.5 IM SUSY: 0.5000 mL | PREFILLED_SYRINGE | Freq: Once | INTRAMUSCULAR | Status: DC

## 2023-04-09 MED ORDER — LIDOCAINE HCL (PF) 1 % IJ SOLN: 30.0000 mL | INTRAMUSCULAR | Status: DC | PRN

## 2023-04-09 MED ORDER — ONDANSETRON HCL 4 MG/2ML IJ SOLN: 4.0000 mg | INTRAMUSCULAR | Status: DC | PRN

## 2023-04-09 MED ORDER — WITCH HAZEL-GLYCERIN EX PADS: 1.0000 | MEDICATED_PAD | CUTANEOUS | Status: DC | PRN

## 2023-04-09 MED ORDER — ACETAMINOPHEN 325 MG PO TABS: 650.0000 mg | ORAL_TABLET | ORAL | Status: DC | PRN

## 2023-04-09 MED ORDER — OXYCODONE HCL 5 MG PO TABS
5.0000 mg | ORAL_TABLET | ORAL | Status: DC | PRN
  Administered 2023-04-09 – 2023-04-10 (×2): 5 mg via ORAL
  Filled 2023-04-09 (×2): qty 1

## 2023-04-09 MED ORDER — FENTANYL-BUPIVACAINE-NACL 0.5-0.125-0.9 MG/250ML-% EP SOLN
12.0000 mL/h | EPIDURAL | Status: DC | PRN
  Administered 2023-04-09: 12 mL/h via EPIDURAL
  Filled 2023-04-09: qty 250

## 2023-04-09 MED ORDER — OXYCODONE-ACETAMINOPHEN 5-325 MG PO TABS: 2.0000 | ORAL_TABLET | ORAL | Status: DC | PRN

## 2023-04-09 MED ORDER — LIDOCAINE HCL (PF) 1 % IJ SOLN
INTRAMUSCULAR | Status: DC | PRN
  Administered 2023-04-09: 11 mL via EPIDURAL

## 2023-04-09 MED ORDER — FLEET ENEMA RE ENEM: 1.0000 | ENEMA | RECTAL | Status: DC | PRN

## 2023-04-09 MED ORDER — LACTATED RINGERS IV SOLN: 500.0000 mL | INTRAVENOUS | Status: DC | PRN

## 2023-04-09 MED ORDER — NIFEDIPINE ER OSMOTIC RELEASE 30 MG PO TB24
30.0000 mg | ORAL_TABLET | Freq: Every day | ORAL | Status: DC
  Administered 2023-04-09 – 2023-04-11 (×3): 30 mg via ORAL
  Filled 2023-04-09 (×3): qty 1

## 2023-04-09 MED ORDER — OXYTOCIN BOLUS FROM INFUSION
333.0000 mL | Freq: Once | INTRAVENOUS | Status: AC
  Administered 2023-04-09: 333 mL via INTRAVENOUS

## 2023-04-09 MED ORDER — ONDANSETRON HCL 4 MG/2ML IJ SOLN: 4.0000 mg | Freq: Four times a day (QID) | INTRAMUSCULAR | Status: DC | PRN

## 2023-04-09 MED ORDER — COCONUT OIL OIL: 1.0000 | TOPICAL_OIL | Status: DC | PRN

## 2023-04-09 MED ORDER — TERBUTALINE SULFATE 1 MG/ML IJ SOLN
0.2500 mg | Freq: Once | INTRAMUSCULAR | Status: AC
  Administered 2023-04-09: 0.25 mg via SUBCUTANEOUS

## 2023-04-09 MED ORDER — OXYCODONE-ACETAMINOPHEN 5-325 MG PO TABS: 1.0000 | ORAL_TABLET | ORAL | Status: DC | PRN

## 2023-04-09 MED ORDER — PRENATAL MULTIVITAMIN CH
1.0000 | ORAL_TABLET | Freq: Every day | ORAL | Status: DC
  Administered 2023-04-10: 1 via ORAL
  Filled 2023-04-09: qty 1

## 2023-04-09 MED ORDER — MEASLES, MUMPS & RUBELLA VAC IJ SOLR: 0.5000 mL | Freq: Once | INTRAMUSCULAR | Status: DC

## 2023-04-09 MED ORDER — ZOLPIDEM TARTRATE 5 MG PO TABS: 5.0000 mg | ORAL_TABLET | Freq: Every evening | ORAL | Status: DC | PRN

## 2023-04-09 MED ORDER — DIPHENHYDRAMINE HCL 50 MG/ML IJ SOLN: 12.5000 mg | INTRAMUSCULAR | Status: DC | PRN

## 2023-04-09 MED ORDER — SENNOSIDES-DOCUSATE SODIUM 8.6-50 MG PO TABS
2.0000 | ORAL_TABLET | ORAL | Status: DC
  Administered 2023-04-10: 2 via ORAL
  Filled 2023-04-09: qty 2

## 2023-04-09 MED ORDER — TERBUTALINE SULFATE 1 MG/ML IJ SOLN
INTRAMUSCULAR | Status: AC
  Filled 2023-04-09: qty 1

## 2023-04-09 MED ORDER — LACTATED RINGERS IV SOLN: INTRAVENOUS | Status: DC

## 2023-04-09 MED ORDER — EPHEDRINE 5 MG/ML INJ: 10.0000 mg | INTRAVENOUS | Status: DC | PRN

## 2023-04-09 MED ORDER — DIBUCAINE (PERIANAL) 1 % EX OINT: 1.0000 | TOPICAL_OINTMENT | CUTANEOUS | Status: DC | PRN

## 2023-04-09 MED ORDER — PHENYLEPHRINE 80 MCG/ML (10ML) SYRINGE FOR IV PUSH (FOR BLOOD PRESSURE SUPPORT): 80.0000 ug | PREFILLED_SYRINGE | INTRAVENOUS | Status: DC | PRN

## 2023-04-09 MED ORDER — FENTANYL CITRATE (PF) 100 MCG/2ML IJ SOLN
50.0000 ug | INTRAMUSCULAR | Status: DC | PRN
  Administered 2023-04-09: 100 ug via INTRAVENOUS
  Filled 2023-04-09: qty 2

## 2023-04-09 MED ORDER — POTASSIUM CHLORIDE CRYS ER 20 MEQ PO TBCR
20.0000 meq | EXTENDED_RELEASE_TABLET | Freq: Every day | ORAL | Status: DC
  Administered 2023-04-09 – 2023-04-11 (×3): 20 meq via ORAL
  Filled 2023-04-09 (×3): qty 1

## 2023-04-09 MED ORDER — LACTATED RINGERS IV SOLN
500.0000 mL | Freq: Once | INTRAVENOUS | Status: AC
  Administered 2023-04-09: 500 mL via INTRAVENOUS

## 2023-04-09 MED ORDER — ONDANSETRON HCL 4 MG PO TABS: 4.0000 mg | ORAL_TABLET | ORAL | Status: DC | PRN

## 2023-04-09 MED ORDER — IBUPROFEN 600 MG PO TABS
600.0000 mg | ORAL_TABLET | Freq: Four times a day (QID) | ORAL | Status: DC
  Administered 2023-04-09 – 2023-04-11 (×7): 600 mg via ORAL
  Filled 2023-04-09 (×7): qty 1

## 2023-04-09 MED ORDER — BENZOCAINE-MENTHOL 20-0.5 % EX AERO
1.0000 | INHALATION_SPRAY | CUTANEOUS | Status: DC | PRN
  Administered 2023-04-09: 1 via TOPICAL
  Filled 2023-04-09 (×2): qty 56

## 2023-04-09 MED ORDER — OXYTOCIN-SODIUM CHLORIDE 30-0.9 UT/500ML-% IV SOLN
2.5000 [IU]/h | INTRAVENOUS | Status: DC
  Filled 2023-04-09: qty 500

## 2023-04-09 MED ORDER — SIMETHICONE 80 MG PO CHEW: 80.0000 mg | CHEWABLE_TABLET | ORAL | Status: DC | PRN

## 2023-04-09 NOTE — H&P (Signed)
 Obstetric History and Physical  Cassidy Meyer is a 28 y.o. G3P1011 with IUP at [redacted]w[redacted]d who presented to MAU with contractions over the last day, that became more frequent. Patient states she has been having  regular, every 3 minutes contractions, minimal vaginal bleeding, intact membranes, with active fetal movement.  In MAU, she was noted to be in latent labor but her BPs were elevated.  Patient denied any headaches, visual symptoms, RUQ/epigastric pain or other concerning symptoms. Given her persistent elevated BPs, the decision was made to proceed with IOL.  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   Prenatal labs and studies: ABO, Rh: --/--/B POS (03/22 0400) Antibody: NEG (03/22 0400) Rubella: 5.66 (09/11 0938) RPR: Non Reactive (01/08 0842)  HBsAg: Negative (09/11 8657)  HIV: Non Reactive (01/08 0842)  QIO:NGEXBMWU/-- (02/26 1345) 2 hr Glucola  normal Genetic screening  LR NIPS, negative [employer] US normal  Past Medical History:  Diagnosis Date   Asthma    when young   Bronchitis    Eczema    Headache     Past Surgical History:  Procedure Laterality Date   APPENDECTOMY     HERNIA REPAIR      OB History  Gravida Para Term Preterm AB Living  3 1 1  0 1 1  SAB IAB Ectopic Multiple Live Births  0 1 0 0 1    # Outcome Date GA Lbr Len/2nd Weight Sex Type Anes PTL Lv  3 Current            2 Term 09/02/17 [redacted]w[redacted]d 26:01 / 01:33 3810 g F Vag-Spont EPI  LIV     Birth Comments: wnl  1 IAB 2015            Social History   Socioeconomic History   Marital status: Single    Spouse name: Not on file   Number of children: Not on file   Years of education: Not on file   Highest education level: Not on file  Occupational History   Occupation: Event organiser: [employer]  Tobacco Use   Smoking status: Former    Current packs/day: 0.25    Average packs/day: 0.3 packs/day for 7.0 years (1.8 ttl pk-yrs)    Types: Cigarettes   Smokeless tobacco: Never  Vaping Use   Vaping status: Never Used  Substance and Sexual Activity   Alcohol use: No   Drug use: Not Currently   Sexual activity: Yes    Birth control/protection: None  Other Topics Concern   Not on file  Social History Narrative   Not on file   Social Drivers of Health   Financial Resource Strain: Not on file  Food Insecurity: No Food Insecurity (08/29/2017)   Hunger Vital Sign    Worried About Running Out of Food in the Last Year: Never true    Ran Out of Food in the Last Year: Never true  Transportation Needs: No Transportation Needs (08/29/2017)  PRAPARE - Administrator, Civil Service (Medical): No    Lack of Transportation (Non-Medical): No  Physical Activity: Inactive (08/29/2017)   Exercise Vital Sign    Days of Exercise per Week: 0 days    Minutes of Exercise per Session: 0 min  Stress: No Stress Concern Present (08/29/2017)   [employer] of Occupational Health - Occupational Stress Questionnaire    Feeling of Stress : Not at all  Social Connections: Moderately Integrated (08/29/2017)   Social Connection and Isolation Panel [NHANES]    Frequency of Communication with Friends and Family: More than three times a week    Frequency of Social Gatherings with Friends and Family: Once a week    Attends Religious Services: 1 to 4 times per year    Active Member of [employer] or Organizations: No     Attends Engineer, structural: Never    Marital Status: Living with partner    Family History  Problem Relation Age of Onset   Hypertension Mother    Epilepsy Mother    Heart disease Mother    Healthy Father    Cancer Maternal Aunt        bladder   Dementia Maternal Grandmother    Diabetes Neg Hx     Medications Prior to Admission  Medication Sig Dispense Refill Last Dose/Taking   Prenatal Multivit-Min-Fe-FA (PRENATAL VITAMINS PO) Take 1 tablet by mouth daily.    04/08/2023   Blood Pressure Monitoring (BLOOD PRESSURE KIT) DEVI 1 Device by Does not apply route once a week. 1 each 0     No Known Allergies  Review of Systems: Negative except for what is mentioned in HPI.  Physical Exam: (done by Dr. Sundra Aland) BP (!) 155/89   Pulse 96   Temp (!) 97.5 F (36.4 C) (Oral)   Resp 18   Ht 5\' 4"  (1.626 m)   Wt 105.1 kg   LMP 07/06/2022   SpO2 100%   BMI 39.79 kg/m  CONSTITUTIONAL: Well-developed, well-nourished female in no acute distress.  HENT:  Normocephalic, atraumatic, External right and left ear normal. Oropharynx is clear and moist EYES: Conjunctivae and EOM are normal. Pupils are equal, round, and reactive to light. No scleral icterus.  NECK: Normal range of motion, supple, no masses SKIN: Skin is warm and dry. No rash noted. Not diaphoretic. No erythema. No pallor. NEUROLOGIC: Alert and oriented to person, place, and time. Normal reflexes, muscle tone coordination. No cranial nerve deficit noted. PSYCHIATRIC: Normal mood and affect. Normal behavior. Normal judgment and thought content. CARDIOVASCULAR: Normal heart rate noted, regular rhythm RESPIRATORY: Effort and breath sounds normal, no problems with respiration noted ABDOMEN: Soft, nontender, nondistended, gravid. MUSCULOSKELETAL: Normal range of motion. No edema and no tenderness. 2+ distal pulses.  Cervical Exam: Dilation: 3.5 Effacement (%): 80 Station: -3 Presentation: Vertex Exam by::  Lucianne Muss MD  Presentation: cephalic FHT:  Baseline rate 145 bpm   Variability moderate  Accelerations present   Decelerations none Contractions: Every 2-5 mins   Pertinent Labs/Studies:   Results for orders placed or performed during the hospital encounter of 04/09/23 (from the past 24 hours)  CBC     Status: Abnormal   Collection Time: 04/09/23  4:00 AM  Result Value Ref Range   WBC 11.3 (H) 4.0 - 10.5 K/uL   RBC 4.36 3.87 - 5.11 MIL/uL   Hemoglobin 11.8 (L) 12.0 - 15.0 g/dL   HCT 16.1 09.6 - 04.5 %   MCV 83.3 80.0 -  100.0 fL   MCH 27.1 26.0 - 34.0 pg   MCHC 32.5 30.0 - 36.0 g/dL   RDW 40.9 81.1 - 91.4 %   Platelets 148 (L) 150 - 400 K/uL   nRBC 0.0 0.0 - 0.2 %  Type and screen [facility]     Status: None   Collection Time: 04/09/23  4:00 AM  Result Value Ref Range   ABO/RH(D) B POS    Antibody Screen NEG    Sample Expiration      04/12/2023,2359 Performed at [facility], [address], [place], [place] [address]   Comprehensive metabolic panel     Status: Abnormal   Collection Time: 04/09/23  4:00 AM  Result Value Ref Range   Sodium 135 135 - 145 mmol/L   Potassium 4.4 3.5 - 5.1 mmol/L   Chloride 103 98 - 111 mmol/L   CO2 22 22 - 32 mmol/L   Glucose, Bld 80 70 - 99 mg/dL   BUN 7 6 - 20 mg/dL   Creatinine, Ser 6.21 0.44 - 1.00 mg/dL   Calcium 9.5 8.9 - 30.8 mg/dL   Total Protein 7.5 6.5 - 8.1 g/dL   Albumin 3.2 (L) 3.5 - 5.0 g/dL   AST 28 15 - 41 U/L   ALT 13 0 - 44 U/L   Alkaline Phosphatase 122 38 - 126 U/L   Total Bilirubin 0.8 0.0 - 1.2 mg/dL   GFR, Estimated >65 >78 mL/min   Anion gap 10 5 - 15    Assessment : Cassidy Meyer is a 28 y.o. G3P1011 at [redacted]w[redacted]d being induction/augmentation of labor due to [facility].  Plan: Labor: Augmentation ordered with pitocin as per protocol. Analgesia as needed. FWB: Reassuring fetal heart tracing.  GBS negative Delivery plan: Hopeful for vaginal delivery   Jaynie Collins, MD, FACOG Obstetrician &  Gynecologist, [facility] for [employer], [facility]

## 2023-04-09 NOTE — Progress Notes (Signed)
 MOB was referred for history of depression. * Referral screened out by Clinical Social Worker because none of the following criteria appear to apply: ~ History of anxiety/depression during this pregnancy, or of post-partum depression following prior delivery. ~ Diagnosis of anxiety and/or depression within last 3 years. MOB's diagnosis of depression dates back to 2016. No mental health concerns noted in prenatal care record.  OR * MOB's symptoms currently being treated with medication and/or therapy. Please contact the Clinical Social Worker if needs arise, by [facility] request, or if MOB scores greater than 9/yes to question 10 on Edinburgh Postpartum Depression Screen.  Signed,  Norberto Sorenson, MSW, [place], LCASA 04/09/2023 4:16 PM

## 2023-04-09 NOTE — Anesthesia Procedure Notes (Signed)
 Epidural Patient location during procedure: OB Start time: 04/09/2023 5:36 AM End time: 04/09/2023 5:52 AM  Staffing Anesthesiologist: Lowella Curb, MD Performed: anesthesiologist   Preanesthetic Checklist Completed: patient identified, IV checked, site marked, risks and benefits discussed, surgical consent, monitors and equipment checked, pre-op evaluation and timeout performed  Epidural Patient position: sitting Prep: ChloraPrep Patient monitoring: heart rate, cardiac monitor, continuous pulse ox and blood pressure Approach: midline Location: L2-L3 Injection technique: LOR saline  Needle:  Needle type: Tuohy  Needle gauge: 17 G Needle length: 9 cm Needle insertion depth: 9 cm Catheter type: closed end flexible Catheter size: 20 Guage Catheter at skin depth: 15 cm Test dose: negative  Assessment Events: blood not aspirated, injection not painful, no injection resistance, no paresthesia and negative IV test  Additional Notes Reason for block:procedure for pain

## 2023-04-09 NOTE — Anesthesia Preprocedure Evaluation (Signed)
 Anesthesia Evaluation  Patient identified by MRN, date of birth, ID band Patient awake    Reviewed: Allergy & Precautions, H&P , NPO status , Patient's Chart, lab work & pertinent test results  Airway Mallampati: II  TM Distance: >3 FB Neck ROM: Full    Dental no notable dental hx. (+) Teeth Intact   Pulmonary asthma , former smoker   Pulmonary exam normal breath sounds clear to auscultation       Cardiovascular negative cardio ROS Normal cardiovascular exam Rhythm:Regular Rate:Normal     Neuro/Psych  Headaches   Depression     negative psych ROS   GI/Hepatic negative GI ROS, Neg liver ROS,,,  Endo/Other  negative endocrine ROS  Obesity  Renal/GU negative Renal ROS  negative genitourinary   Musculoskeletal negative musculoskeletal ROS (+)    Abdominal  (+) + obese  Peds negative pediatric ROS (+)  Hematology negative hematology ROS (+)   Anesthesia Other Findings   Reproductive/Obstetrics (+) Pregnancy                             Anesthesia Physical Anesthesia Plan  ASA: II  Anesthesia Plan: Epidural   Post-op Pain Management:    Induction:   PONV Risk Score and Plan:   Airway Management Planned: Natural Airway  Additional Equipment:   Intra-op Plan:   Post-operative Plan:   Informed Consent: I have reviewed the patients History and Physical, chart, labs and discussed the procedure including the risks, benefits and alternatives for the proposed anesthesia with the patient or authorized representative who has indicated his/her understanding and acceptance.       Plan Discussed with: Anesthesiologist  Anesthesia Plan Comments:         Anesthesia Quick Evaluation

## 2023-04-09 NOTE — Lactation Note (Signed)
 This note was copied from a baby's chart. Lactation Consultation Note  Patient Name: Cassidy Meyer [id number] Date: 04/09/2023 Age:29 hours Reason for consult: Initial assessment;Term  P2- MOB reports that infant has nursed great since birth with no problems. MOB has no questions or concerns at this time. Infant had fed recently, so LC could not assess a latch. LC encouraged MOB to call for a latch assessment at some point. [facility] sent [facility] referral today. LC reviewed the first 24 hr birthday nap, day 2 cluster feeding, feeding infant on cue 8-12x in 24 hrs, not allowing infant to go over 3 hrs without a feeding, CDC milk storage guidelines and LC services handout. LC encouraged MOB to call for further assistance as needed.  Maternal Data Does the patient have breastfeeding experience prior to this delivery?: Yes How long did the patient breastfeed?: a few days, left the hospital formula feeding only because infant would not latch  Feeding Mother's Current Feeding Choice: Breast Milk  Lactation Tools Discussed/Used Pump Education: Milk Storage  Interventions Interventions: Breast feeding basics reviewed;Education;LC Services brochure  Discharge Discharge Education: Engorgement and breast care;Warning signs for feeding baby Pump: Referral sent for [facility] Pump  Consult Status Consult Status: Follow-up Date: 04/10/23 Follow-up type: In-patient    Dema Severin BS, IBCLC 04/09/2023, 5:28 PM

## 2023-04-09 NOTE — Progress Notes (Signed)
 Cassidy Meyer 1995/02/15  Patient presents for labor evaluation, but elevated bp noted. Provider reviews current bps and pregnancy vitals noting several elevations.  Discussed diagnosis of GHTN and recommendation for admission with possible augmentation.  Patient agreeable.  Informed that in setting of new diagnosis she is no longer a candidate for WB. Patient verbalizes understanding and without questions. Nurse instructed to place standard admission orders and contact L&D team accordingly.  Cherre Robins MSN, CNM Advanced Practice Provider, Center for [employer]

## 2023-04-09 NOTE — Progress Notes (Signed)
 Patient ID: Cassidy Meyer, female   DOB: December 12, 1995, 28 y.o.   MRN: [id number]  Comfortable w epidural; denies H/A and VD; had some mod bldg earlier  BPs 138/91, 135/66 FHR 150s, +accels, no decels currently Ctx q 2-3 mins  Cx 9/C/vtx 0; AROM for mod MSF; no active bldg appreciated now> prob due to cx change  IUP@39 .4wks Now pre-e w/o SF Active labor-transition  P/C: 0.48  Await urge to push; if none in 2 hrs, will check cx again Anticipate vag delivery  Cassidy Meyer [facility] 04/09/2023 9:56 AM

## 2023-04-09 NOTE — Lactation Note (Signed)
 This note was copied from a baby's chart. Lactation Consultation Note  Patient Name: Cassidy Meyer [id number] Date: 04/09/2023 Age:28 hours Reason for consult: Follow-up assessment;Term     Charyl Dancer, RN Registered Nurse Lactation   Lactation Note     Signed   Date of Service: 04/09/2023  9:48 PM  suggestion   This note was copied to the following chart(s): [name], [name]     Signed      Show:Clear all [x] Written[x] Templated[] Copied  Added by: [x] Charyl Dancer, RN  [] Hover for details Lactation Consultation Note   Patient Name: Cassidy Meyer [id number] Date: 04/09/2023 Age:35 hours Reason for consult: Follow-up assessment;Early term 37-38.6wks;Maternal endocrine disorder Mom had the baby on the breast BF when LC came into rm. Praised mom. Mom stated this baby has been BF very well. Mom stated her 1st child wouldn't BF. Mom stated this baby is loving BF. Mom denies painful latches. LC worked w/positioning, support, props and body alignment.  Answered questions mom had.  Encouraged to call if needs assistance. Praised mom for good feeding.   Maternal Data Has patient been taught Hand Expression?: Yes Does the patient have breastfeeding experience prior to this delivery?: Yes How long did the patient breastfeed?: 6 months of combo feeding   Feeding Mother's Current Feeding Choice: Breast Milk and Formula   LATCH Score Latch: Grasps breast easily, tongue down, lips flanged, rhythmical sucking.   Audible Swallowing: A few with stimulation   Type of Nipple: Everted at rest and after stimulation   Comfort (Breast/Nipple): Soft / non-tender   Hold (Positioning): Assistance needed to correctly position infant at breast and maintain latch.   LATCH Score: 8     Lactation Tools Discussed/Used Pump Education: Milk Storage   Interventions Interventions: Breast feeding basics reviewed;Assisted with latch;Skin to skin;Breast massage;Breast  compression;Adjust position;Support pillows;Position options;Education   Discharge Discharge Education: Engorgement and breast care;Warning signs for feeding baby Pump: DEBP;Personal   Consult Status Consult Status: Follow-up Date: 04/10/23 Follow-up type: In-patient       Charyl Dancer 04/09/2023, 9:48 PM                  Maternal Data Has patient been taught Hand Expression?: Yes  Feeding    LATCH Score Latch: Grasps breast easily, tongue down, lips flanged, rhythmical sucking.  Audible Swallowing: A few with stimulation  Type of Nipple: Everted at rest and after stimulation  Comfort (Breast/Nipple): Soft / non-tender  Hold (Positioning): Assistance needed to correctly position infant at breast and maintain latch.  LATCH Score: 8   Lactation Tools Discussed/Used    Interventions Interventions: Breast feeding basics reviewed;Assisted with latch;Skin to skin;Breast massage;Hand express;Breast compression;Adjust position;Support pillows;Position options;Education  Discharge    Consult Status Consult Status: Follow-up Date: 04/10/23 Follow-up type: In-patient    Charyl Dancer 04/09/2023, 10:25 PM

## 2023-04-09 NOTE — MAU Note (Signed)
..  Cassidy Meyer is a 28 y.o. at [redacted]w[redacted]d here in MAU reporting: Contractions since 3 pm that are now every 3 minutes.  Trickle of fluid twice the first one was at 4 pm.  Reports some bloody show.  SVE: 1 cm on Wednesday   Pain score: 9/10 Vitals:   04/09/23 0205 04/09/23 0207  BP:  134/86  Pulse:  (!) 106  Resp: 18   Temp: (!) 97.5 F (36.4 C)   SpO2: 99%      FHT:148 Lab orders placed from triage:  fern

## 2023-04-09 NOTE — Discharge Summary (Signed)
 Postpartum Discharge Summary  Date of Service updated***     Patient Name: Cassidy Meyer DOB: January 14, 1996 MRN: [id number]  Date of admission: 04/09/2023 Delivery date:04/09/2023 Delivering provider: Cam Hai D Date of discharge: 04/09/2023  Admitting diagnosis: Normal labor and delivery [O80] Intrauterine pregnancy: [redacted]w[redacted]d     Secondary diagnosis:  Active Problems:   Marginal insertion of umbilical cord affecting management of mother in second trimester   Pre-eclampsia, mild  Additional problems: none    Discharge diagnosis: Term Pregnancy Delivered and Preeclampsia (mild)                                              Post partum procedures: none Augmentation: AROM Complications: None  Hospital course: Onset of Labor With Vaginal Delivery      28 y.o. yo [id number] at [redacted]w[redacted]d was admitted in Latent Labor on 04/09/2023. Labor course was complicated by dx of pre-e without SF (P/C 0.48) upon arrival to MAU for labor eval; also had a SD with length of time head to body= 1 minute. Membrane Rupture Time/Date: 9:28 AM,04/09/2023  Delivery Method:Vaginal, Spontaneous Operative Delivery:N/A Episiotomy: None Lacerations:  None Patient had a postpartum course complicated by taking prophylactic Lasix and K+; she did/did not *** require antihypertenisves.  She is ambulating, tolerating a regular diet, passing flatus, and urinating well. Patient is discharged home in stable condition on 04/09/23.  Newborn Data: Birth date:04/09/2023 Birth time:12:44 PM Gender:Female Living status:Living Apgars:7 ,9  Weight:4390 g (9lb 10.9oz)  Magnesium Sulfate received: No BMZ received: No Rhophylac:N/A MMR:N/A T-DaP: declined prenatally Flu: No RSV Vaccine received: No Transfusion:No  Immunizations received: Immunization History  Administered Date(s) Administered   IPV 03/01/2013   Influenza,inj,Quad PF,6+ Mos 03/01/2013   MMR 03/20/2013   Meningococcal Conjugate 03/01/2013   PPD Test  03/20/2013, 04/16/2013, 09/30/2014, 03/11/2015, 05/12/2015   Td 09/12/2006   Tdap 09/12/2006, 09/03/2017   Varicella 03/20/2013    Physical exam  Vitals:   04/09/23 1244 04/09/23 1245 04/09/23 1300 04/09/23 1315  BP:   139/75 (!) 140/89  Pulse:   (!) 126 (!) 134  Resp:      Temp:      TempSrc:      SpO2: 97% 97%    Weight:      Height:       General: {Exam; general:21111117} Lochia: {Desc; appropriate/inappropriate:30686::"appropriate"} Uterine Fundus: {Desc; firm/soft:30687} Incision: {Exam; incision:21111123} DVT Evaluation: {Exam; dvt:2111122} Labs: Lab Results  Component Value Date   WBC 15.6 (H) 04/09/2023   HGB 11.3 (L) 04/09/2023   HCT 34.9 (L) 04/09/2023   MCV 83.7 04/09/2023   PLT 124 (L) 04/09/2023      Latest Ref Rng & Units 04/09/2023    4:00 AM  CMP  Glucose 70 - 99 mg/dL 80   BUN 6 - 20 mg/dL 7   Creatinine 1.47 - 8.29 mg/dL 5.62   Sodium 130 - 865 mmol/L 135   Potassium 3.5 - 5.1 mmol/L 4.4   Chloride 98 - 111 mmol/L 103   CO2 22 - 32 mmol/L 22   Calcium 8.9 - 10.3 mg/dL 9.5   Total Protein 6.5 - 8.1 g/dL 7.5   Total Bilirubin 0.0 - 1.2 mg/dL 0.8   Alkaline Phos 38 - 126 U/L 122   AST 15 - 41 U/L 28   ALT 0 - 44 U/L 13    Edinburgh  Score:    09/29/2017    3:01 PM  Edinburgh Postnatal Depression Scale Screening Tool  I have been able to laugh and see the funny side of things. 0  I have looked forward with enjoyment to things. 0  I have blamed myself unnecessarily when things went wrong. 0  I have been anxious or worried for no good reason. 0  I have felt scared or panicky for no good reason. 0  Things have been getting on top of me. 0  I have been so unhappy that I have had difficulty sleeping. 0  I have felt sad or miserable. 0  I have been so unhappy that I have been crying. 0  The thought of harming myself has occurred to me. 0  Edinburgh Postnatal Depression Scale Total 0   No data recorded  After visit meds:  Allergies as of  04/09/2023   No Known Allergies   Med Rec must be completed prior to using this [facility]***        Discharge home in stable condition Infant Feeding: {Baby feeding:23562} Infant Disposition:{CHL IP OB HOME WITH ZOXWRU:04540} Discharge instruction: per After Visit Summary and Postpartum booklet. Activity: Advance as tolerated. Pelvic rest for 6 weeks.  Diet: routine diet Future Appointments: Future Appointments  Date Time Provider Department Center  04/13/2023 11:15 AM Sue Lush, FNP [facility] None   Follow up Visit:  Arabella Merles, CNM  P [facility] Please schedule this patient for Postpartum visit in: 6 weeks with the following provider: Any provider In-Person For C/S patients schedule nurse incision check in weeks 2 weeks: no High risk pregnancy complicated by: mild pre-e Delivery mode:  SVD Anticipated Birth Control:  other/unsure PP Procedures needed: RN BP check in 1wk Schedule Integrated BH visit: no   04/09/2023 Arabella Merles, CNM

## 2023-04-10 MED ORDER — IBUPROFEN 600 MG PO TABS: 600.0000 mg | ORAL_TABLET | Freq: Four times a day (QID) | ORAL | 0 refills | Status: AC | PRN

## 2023-04-10 MED ORDER — POTASSIUM CHLORIDE CRYS ER 20 MEQ PO TBCR: 20.0000 meq | EXTENDED_RELEASE_TABLET | Freq: Every day | ORAL | 0 refills | Status: AC

## 2023-04-10 MED ORDER — FUROSEMIDE 20 MG PO TABS: 20.0000 mg | ORAL_TABLET | Freq: Every day | ORAL | 0 refills | Status: AC

## 2023-04-10 MED ORDER — NIFEDIPINE ER 30 MG PO TB24: 30.0000 mg | ORAL_TABLET | Freq: Every day | ORAL | 2 refills | Status: AC

## 2023-04-10 NOTE — Lactation Note (Signed)
 This note was copied from a baby's chart. Lactation Consultation Note  Patient Name: Cassidy Meyer [id number] Date: 04/10/2023 Age:29 hours Reason for consult: Follow-up assessment;Term  Baby latched when [name] entered room with intermittent swallows.  Mother states she started offering formula last night but baby spit up afterwards so she plans to exclusively breastfeed.  Offered encouragement and suggest mother call for help as needed.  [employer] pump delivered.   Feeding Mother's Current Feeding Choice: Breast Milk and Formula  LATCH Score Latch: Grasps breast easily, tongue down, lips flanged, rhythmical sucking.  Audible Swallowing: A few with stimulation  Type of Nipple: Everted at rest and after stimulation  Comfort (Breast/Nipple): Soft / non-tender  Hold (Positioning): No assistance needed to correctly position infant at breast.  LATCH Score: 9  Interventions Interventions: Education  Consult Status Consult Status: Follow-up Date: 04/11/23 Follow-up type: In-patient   Cassidy Pulley  RN, IBCLC 04/10/2023, 1:36 PM

## 2023-04-10 NOTE — Progress Notes (Addendum)
 Post Partum Day #1 Subjective: no complaints, up ad lib, and tolerating PO; breastfeeding going okay; condoms for contraception; started on Lasix & K+ as well as Procardia XL 30mg  yesterday  Objective:  Blood pressure 113/60, pulse 82, temperature (!) 97.5 F (36.4 C), temperature source Oral, resp. rate 18, height 5\' 4"  (1.626 m), weight 105.1 kg, last menstrual period 07/06/2022, SpO2 98%, unknown if currently breastfeeding. Nl BPs overnight  Physical Exam:  General: alert, cooperative, and no distress Lochia: appropriate Uterine Fundus: firm DVT Evaluation: No evidence of DVT seen on physical exam.  Recent Labs    04/09/23 0400 04/09/23 1335  HGB 11.8* 11.3*  HCT 36.3 34.9*    Assessment/Plan: Discharge home per her request for early d/c today as long as the baby can go (see Discharge Summary).   LOS: 1 day   Arabella Merles, CNM 04/10/2023, 8:43 AM

## 2023-04-10 NOTE — Anesthesia Postprocedure Evaluation (Signed)
 Anesthesia Post Note  Patient: Tais Koestner  Procedure(s) Performed: AN AD HOC LABOR EPIDURAL     Patient location during evaluation: Mother Baby Anesthesia Type: Epidural Level of consciousness: awake and alert Pain management: pain level controlled Vital Signs Assessment: post-procedure vital signs reviewed and stable Respiratory status: spontaneous breathing, nonlabored ventilation and respiratory function stable Cardiovascular status: stable Postop Assessment: no headache, no backache, epidural receding, no apparent nausea or vomiting, patient able to bend at knees, able to ambulate and adequate PO intake Anesthetic complications: no   No notable events documented.  Last Vitals:  Vitals:   04/10/23 0459 04/10/23 1006  BP: 113/60 131/75  Pulse: 82 96  Resp: 18   Temp: (!) 36.4 C   SpO2:      Last Pain:  Vitals:   04/10/23 0640  TempSrc:   PainSc: 0-No pain   Pain Goal:                   Laban Emperor

## 2023-04-11 NOTE — Lactation Note (Signed)
 This note was copied from a baby's chart. Lactation Consultation Note  Patient Name: Girl Peighton Mehra [id number] Date: 04/11/2023 Age:28 years Reason for consult: Follow-up assessment;Term;Infant weight loss;Exclusive pumping and bottle feeding Baby was initially latching and per now plan is to just pump and bottle feed. LC reviewed supply and demand, importance of consistent pumping around the clock 8-10 times both breast for 15 mins.  Mom has the print out for Storage of breastmilk .   Maternal Data    Feeding Mother's Current Feeding Choice: Breast Milk and Formula Nipple Type: Slow - flow   Lactation Tools Discussed/Used Pump Education: Milk Storage  Interventions Interventions: Education;LC Services brochure;CDC Guidelines for Breast Pump Cleaning;CDC milk storage guidelines  Discharge Discharge Education: Engorgement and breast care;Warning signs for feeding baby Pump: Received Stork Pump (per [facility] note)  Consult Status Consult Status: Complete Date: 04/11/23    Kathrin Greathouse 04/11/2023, 10:58 AM

## 2023-04-11 NOTE — Discharge Summary (Signed)
 Postpartum Discharge Summary      Patient Name: Cassidy Meyer DOB: 07/08/1995 MRN: [id number]  Date of admission: 04/09/2023 Delivery date:04/09/2023 Delivering provider: Cam Hai D Date of discharge: 04/11/2023  Admitting diagnosis: Normal labor and delivery [O80] Intrauterine pregnancy: [redacted]w[redacted]d     Secondary diagnosis:  Active Problems:   Marginal insertion of umbilical cord affecting management of mother in second trimester   Pre-eclampsia, mild   Shoulder dystocia during labor and delivery  Additional problems: none    Discharge diagnosis: Term Pregnancy Delivered and Preeclampsia (mild)                                              Post partum procedures: none Augmentation: AROM Complications: None  Hospital course: Onset of Labor With Vaginal Delivery      28 y.o. yo [id number] at [redacted]w[redacted]d was admitted in Latent Labor on 04/09/2023. Labor course was complicated by Labor course was complicated by dx of pre-e without SF (P/C 0.48) upon arrival to [facility] for labor eval; also had a SD with length of time head to body= 1 minute.   Membrane Rupture Time/Date: 9:28 AM,04/09/2023  Delivery Method:Vaginal, Spontaneous Operative Delivery:N/A Episiotomy: None Lacerations:  None Patient had a postpartum course complicated by  prophylactic Lasix and K+; she did require Procardia XL 30mg  for BP control .  She is ambulating, tolerating a regular diet, passing flatus, and urinating well. Patient is discharged home in stable condition on 04/11/23.  Newborn Data: Birth date:04/09/2023 Birth time:12:44 PM Gender:Female Living status:Living Apgars:7 ,9  Weight:4390 g  Magnesium Sulfate received: No BMZ received: No Rhophylac:N/A MMR:N/A T-DaP: declined Flu: No RSV Vaccine received: No Transfusion:No  Immunizations received: Immunization History  Administered Date(s) Administered   IPV 03/01/2013   Influenza,inj,Quad PF,6+ Mos 03/01/2013   MMR 03/20/2013   Meningococcal Conjugate  03/01/2013   PPD Test 03/20/2013, 04/16/2013, 09/30/2014, 03/11/2015, 05/12/2015   Td 09/12/2006   Tdap 09/12/2006, 09/03/2017   Varicella 03/20/2013    Physical exam  Vitals:   04/10/23 0459 04/10/23 1006 04/10/23 1550 04/10/23 2340  BP: 113/60 131/75 133/77 135/71  Pulse: 82 96 91 96  Resp: 18  18 18   Temp: (!) 97.5 F (36.4 C)  98.2 F (36.8 C) 98.4 F (36.9 C)  TempSrc: Oral  Oral Oral  SpO2:    99%  Weight:      Height:       General: alert and cooperative Lochia: appropriate Uterine Fundus: firm Incision: N/A DVT Evaluation: No evidence of DVT seen on physical exam. Labs: Lab Results  Component Value Date   WBC 15.6 (H) 04/09/2023   HGB 11.3 (L) 04/09/2023   HCT 34.9 (L) 04/09/2023   MCV 83.7 04/09/2023   PLT 124 (L) 04/09/2023      Latest Ref Rng & Units 04/09/2023    4:00 AM  CMP  Glucose 70 - 99 mg/dL 80   BUN 6 - 20 mg/dL 7   Creatinine 1.47 - 8.29 mg/dL 5.62   Sodium 130 - 865 mmol/L 135   Potassium 3.5 - 5.1 mmol/L 4.4   Chloride 98 - 111 mmol/L 103   CO2 22 - 32 mmol/L 22   Calcium 8.9 - 10.3 mg/dL 9.5   Total Protein 6.5 - 8.1 g/dL 7.5   Total Bilirubin 0.0 - 1.2 mg/dL 0.8   Alkaline Phos 38 -  126 U/L 122   AST 15 - 41 U/L 28   ALT 0 - 44 U/L 13    Edinburgh Score:    04/09/2023   11:19 PM  Edinburgh Postnatal Depression Scale Screening Tool  I have been able to laugh and see the funny side of things. 0  I have looked forward with enjoyment to things. 0  I have blamed myself unnecessarily when things went wrong. 0  I have been anxious or worried for no good reason. 0  I have felt scared or panicky for no good reason. 0  Things have been getting on top of me. 0  I have been so unhappy that I have had difficulty sleeping. 0  I have felt sad or miserable. 0  I have been so unhappy that I have been crying. 0  The thought of harming myself has occurred to me. 0  Edinburgh Postnatal Depression Scale Total 0   No data recorded  After visit  meds:  Allergies as of 04/11/2023   No Known Allergies      Medication List     STOP taking these medications    Blood Pressure Kit Devi       TAKE these medications    furosemide 20 MG tablet Commonly known as: LASIX Take 1 tablet (20 mg total) by mouth daily.   ibuprofen 600 MG tablet Commonly known as: ADVIL Take 1 tablet (600 mg total) by mouth every 6 (six) hours as needed.   NIFEdipine 30 MG 24 hr tablet Commonly known as: ADALAT CC Take 1 tablet (30 mg total) by mouth daily.   potassium chloride SA 20 MEQ tablet Commonly known as: KLOR-CON M Take 1 tablet (20 mEq total) by mouth daily.   PRENATAL VITAMINS PO Take 1 tablet by mouth daily.         Discharge home in stable condition Infant Feeding: Breast Infant Disposition:home with mother Discharge instruction: per After Visit Summary and Postpartum booklet. Activity: Advance as tolerated. Pelvic rest for 6 weeks.  Diet: routine diet Future Appointments: Future Appointments  Date Time Provider Department Center  04/13/2023 11:15 AM Sue Lush, FNP [facility] None   Follow up Visit:   Arabella Merles, CNM  P [facility] Please schedule this patient for Postpartum visit in: 6 weeks with the following provider: Any provider In-Person For C/S patients schedule nurse incision check in weeks 2 weeks: no High risk pregnancy complicated by: mild pre-e Delivery mode:  SVD Anticipated Birth Control:  other/unsure PP Procedures needed: RN BP check in 1wk Schedule Integrated BH visit: no   04/11/2023 Richardson Landry, CNM

## 2023-04-13 ENCOUNTER — Encounter: Payer: Medicaid Other | Admitting: Obstetrics and Gynecology

## 2023-04-14 ENCOUNTER — Ambulatory Visit

## 2023-04-18 ENCOUNTER — Ambulatory Visit

## 2023-04-21 ENCOUNTER — Telehealth (HOSPITAL_COMMUNITY): Payer: Self-pay

## 2023-04-21 NOTE — Telephone Encounter (Signed)
 04/21/2023 1219  Name: Cassidy Meyer MRN: [id number] DOB: 1995/02/18  Reason for Call:  Transition of Care Hospital Discharge Call  Contact Status: Patient Contact Status: Message  Language assistant needed:          Follow-Up Questions:    Inocente Salles Postnatal Depression Scale:  In the Past 7 Days:    PHQ2-9 Depression Scale:     Discharge Follow-up:    Post-discharge interventions: NA  Signature  Signe Colt

## 2023-05-23 ENCOUNTER — Ambulatory Visit: Admitting: Obstetrics & Gynecology

## 2023-05-23 ENCOUNTER — Encounter: Payer: Self-pay | Admitting: Obstetrics & Gynecology
